# Patient Record
Sex: Female | Born: 1990 | Race: White | Hispanic: No | Marital: Married | Smoking: Never smoker
Health system: Southern US, Community
[De-identification: ages and names within clinical notes are randomized; demographics above are authoritative.]

## PROBLEM LIST (undated history)

## (undated) DIAGNOSIS — F419 Anxiety disorder, unspecified: Secondary | ICD-10-CM

## (undated) DIAGNOSIS — G90A Postural orthostatic tachycardia syndrome (POTS): Secondary | ICD-10-CM

## (undated) DIAGNOSIS — A1801 Tuberculosis of spine: Secondary | ICD-10-CM

## (undated) HISTORY — PX: WISDOM TOOTH EXTRACTION: SHX21

## (undated) HISTORY — PX: WRIST SURGERY: SHX841

## (undated) HISTORY — DX: Tuberculosis of spine: A18.01

## (undated) HISTORY — DX: Anxiety disorder, unspecified: F41.9

---
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
## 2008-05-30 ENCOUNTER — Encounter: Payer: Self-pay | Admitting: Internal Medicine

## 2008-07-29 ENCOUNTER — Encounter: Payer: Self-pay | Admitting: Family Medicine

## 2008-07-31 IMAGING — CT CT HEAD W/O CM
1 of 2 series · 16 of 30 positions shown, 20 images · IV contrast (agent unspecified)
Comparison: 07/13/2005 (normal).

CLINICAL DATA: Syncopal episode, hit back of head. 
 HEAD CT WITHOUT CONTRAST:
TECHNIQUE: Contiguous axial images were obtained from the base of the skull through the vertex according to standard protocol without contrast.

[Series 3: head trauma 2.4 h60s · axial · 0.43mm/px · z∈[-118,+6]mm · 16 of 60 slices shown, 20 images]
[im 4/60  brain]
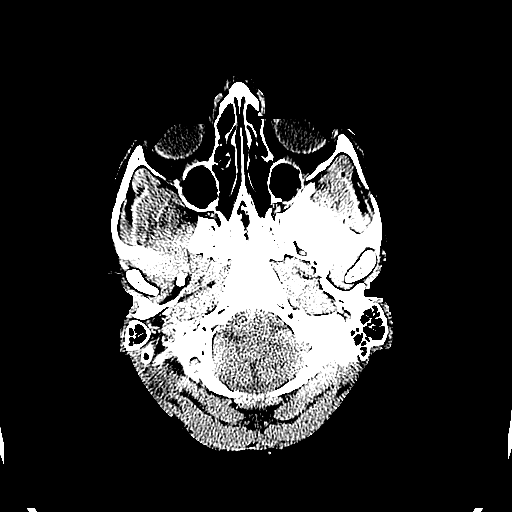
[im 4/60  bone]
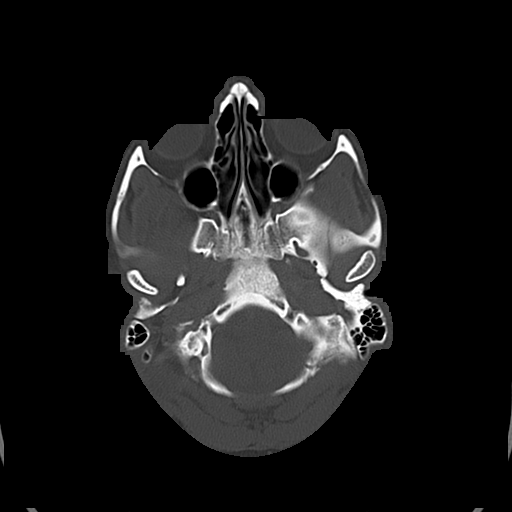
[im 7/60  brain]
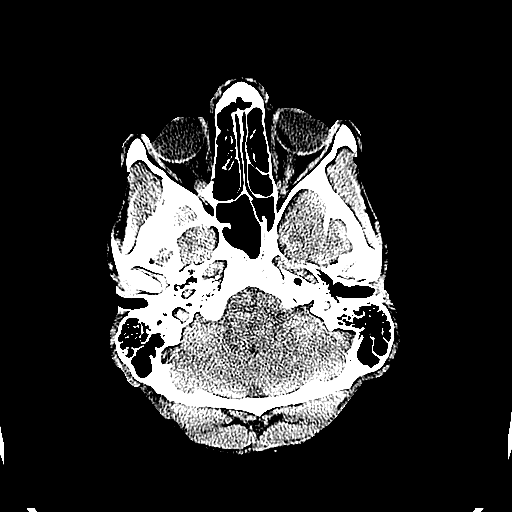
[im 10/60  brain]
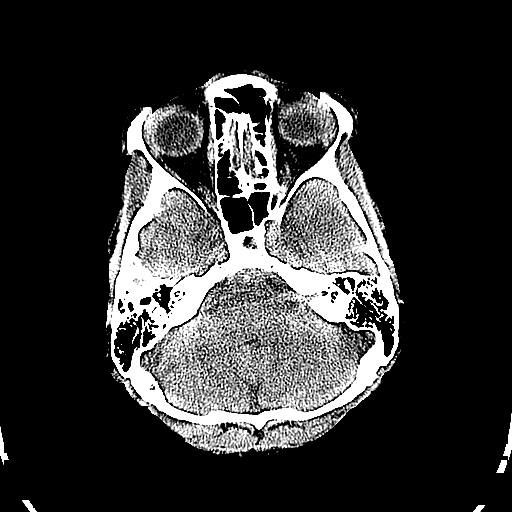
[im 13/60  brain]
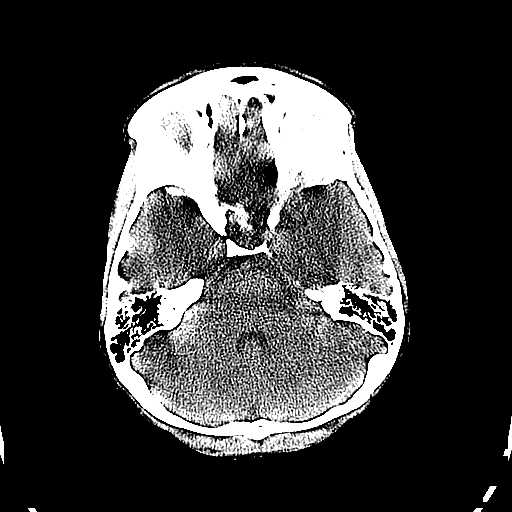
[im 19/60  brain]
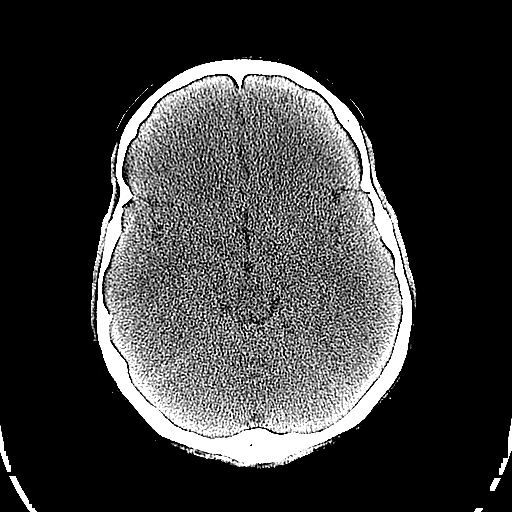
[im 19/60  bone]
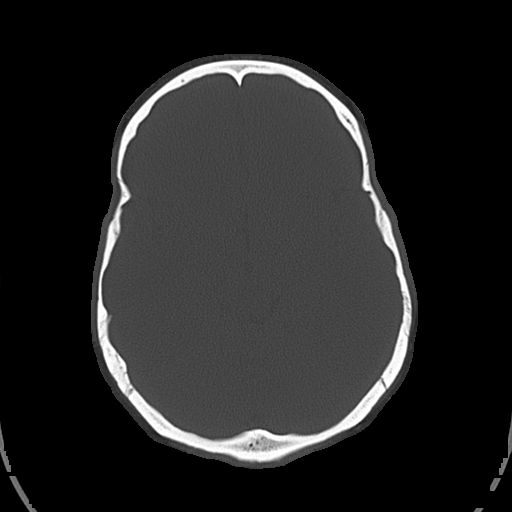
[im 22/60  brain]
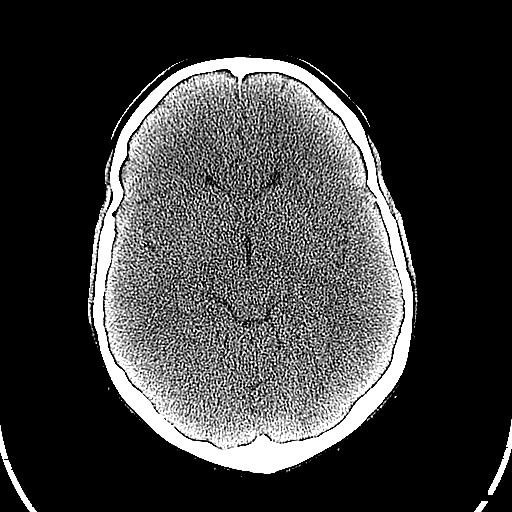
[im 25/60  brain]
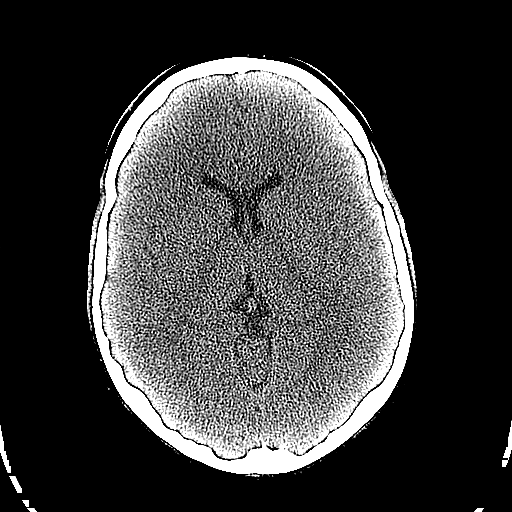
[im 28/60  brain]
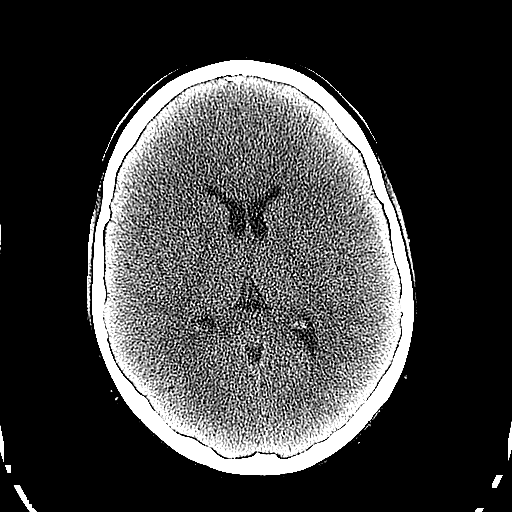
[im 32/60  brain]
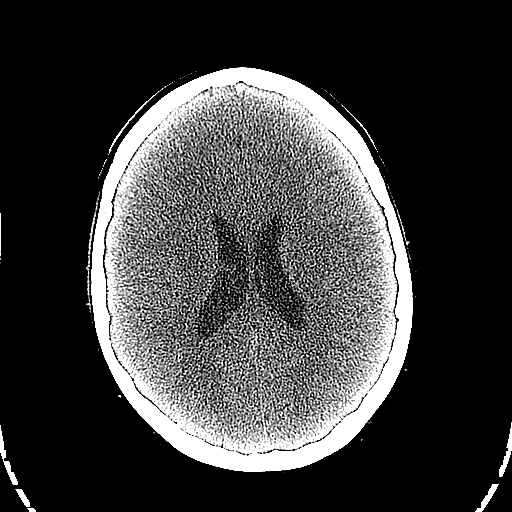
[im 32/60  bone]
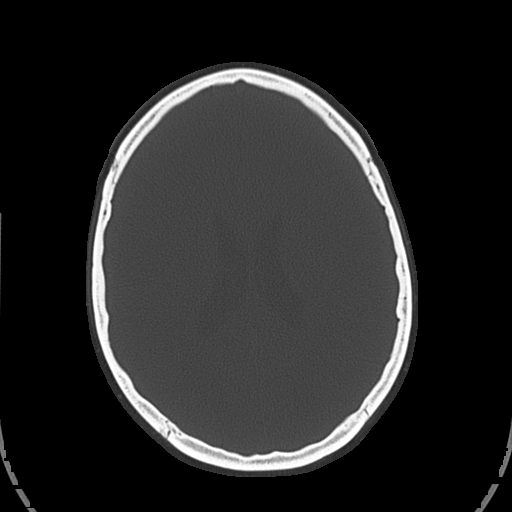
[im 35/60  brain]
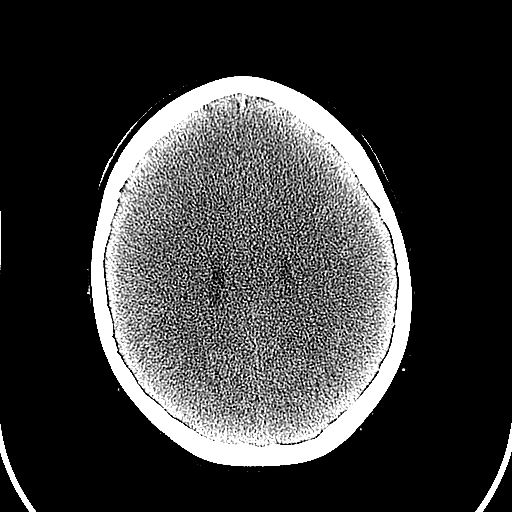
[im 38/60  brain]
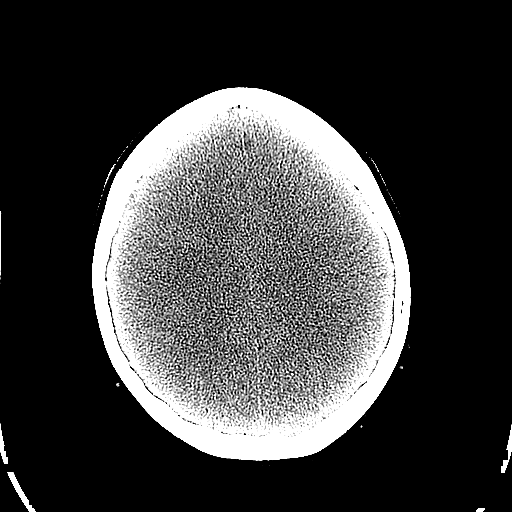
[im 41/60  brain]
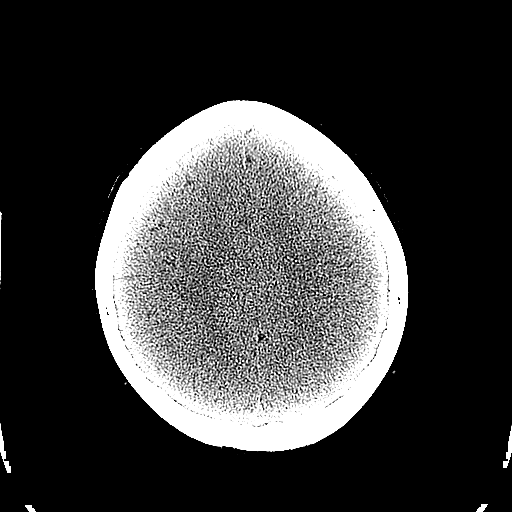
[im 47/60  brain]
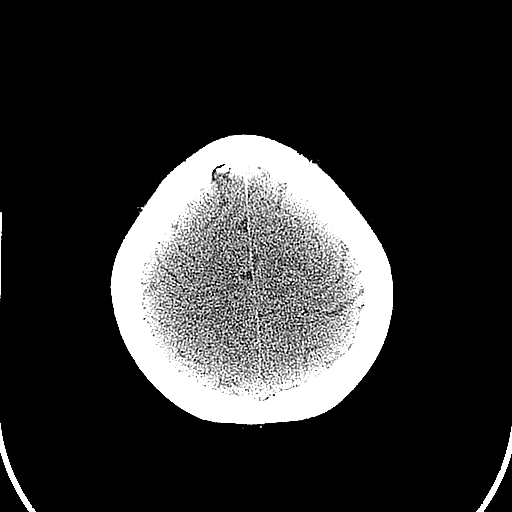
[im 47/60  bone]
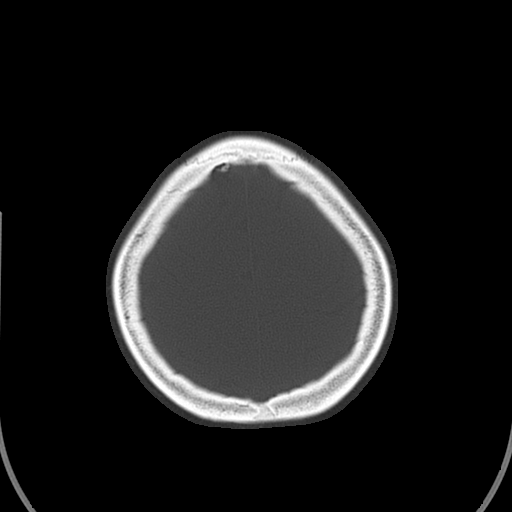
[im 50/60  brain]
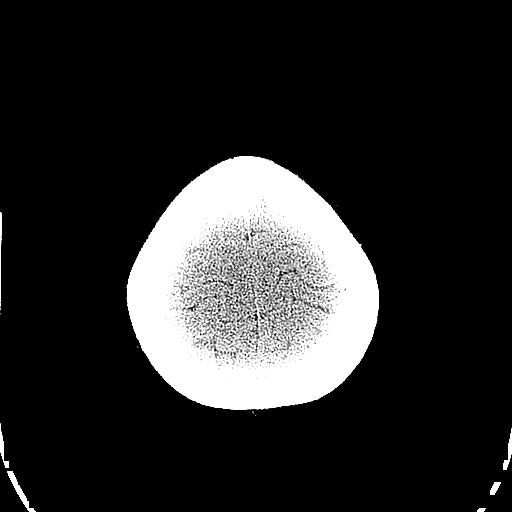
[im 53/60  brain]
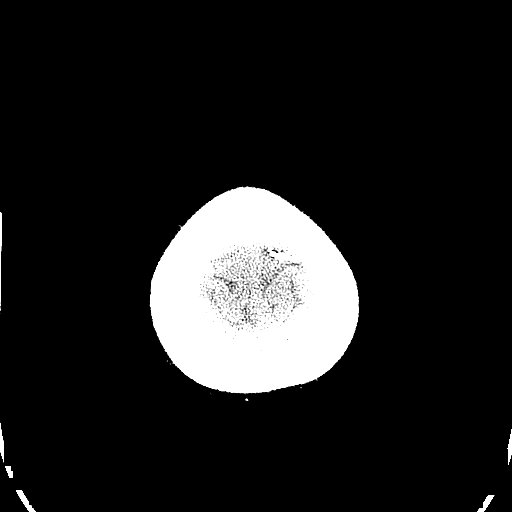
[im 56/60  brain]
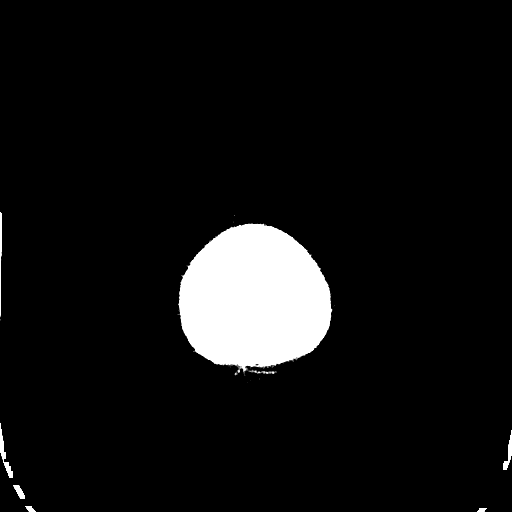

[16 of 30 positions shown; findings below may reference images not displayed]

FINDINGS: There is no evidence of intracranial hemorrhage, brain edema, acute infarct, mass lesion, or mass effect.  No other intra-axial abnormalities are seen, and the ventricles are within normal limits.  No abnormal extra-axial fluid collections or masses are identified.  No skull abnormalities are noted.  No change from 07/13/2005.
IMPRESSION: Negative non-contrast head CT.

## 2009-07-06 ENCOUNTER — Ambulatory Visit: Payer: Self-pay | Admitting: Family Medicine

## 2009-07-06 DIAGNOSIS — M79609 Pain in unspecified limb: Secondary | ICD-10-CM | POA: Insufficient documentation

## 2010-03-30 NOTE — Assessment & Plan Note (Signed)
Summary: swollen hand and thumb//lch   Vital Signs:  Patient profile:   20 year old female Weight:      107 pounds Temp:     98.6 degrees F oral BP sitting:   112 / 70  (left arm)  Vitals Entered By: Doristine Devoid (Jul 06, 2009 9:07 AM) CC: R hand still swollen and painful after fall x1 wk ago    History of Present Illness: 20 yo woman here today for R hand pain and swelling.  pt fell off ladder 1 week ago and fell backward on R wrist and thumb.  immediately started swelling- went to [facility] and was told it wasn't broken after xrays.  was given script for Naproxen- didn't get it filled.  not wearing splint.  pain is most severe at base of thumb.  Current Medications (verified): 1)  Kariva 0.15-0.02/0.01 Mg (21/5)  Tabs (Desogestrel-Ethinyl Estradiol) 2)  Nasacort Aq 55 Mcg/act Aers (Triamcinolone Acetonide(Nasal)) .... 2 Spays Each Nostril Once Daily 3)  Naproxen Dr 500 Mg Tbec (Naproxen) .Marland Kitchen.. 1 Tab By Mouth Two Times A Day X7 Days and Then As Needed.  Take W/ Food.  Allergies (verified): 1)  ! Ceclor  Review of Systems      See HPI  Physical Exam  General:  Well-developed,well-nourished,in no acute distress; alert,appropriate and cooperative throughout examination Msk:  + pain w/ palpation at base of R thumb.  no pain at IP joint.  + TTP over snuffbox.  + edema.  no bruising.  + pain w/ flexion and extension. Pulses:  +2 ulnar/radial pulses   Impression & Recommendations:  Problem # 1:  THUMB PAIN, RIGHT (ICD-729.5) Assessment New pt's pain concerning for carpal/metacarpal fx.  early xrays may not have shown the break.  re-xray and refer to ortho at mom's request.  thumb spica given.  start NSAIDs, ice.  reviewed supportive care and red flags that should prompt return.  Pt expresses understanding and is in agreement w/ this plan. Orders: Ankle / Wrist Splint ([id number]) T-Hand Right 3 views ([id number]) Orthopedic Referral (Ortho)  Complete Medication List: 1)  Kariva  0.15-0.02/0.01 Mg (21/5) Tabs (Desogestrel-ethinyl estradiol) 2)  Nasacort Aq 55 Mcg/act Aers (Triamcinolone acetonide(nasal)) .... 2 spays each nostril once daily 3)  Naproxen Dr 500 Mg Tbec (Naproxen) .Marland Kitchen.. 1 tab by mouth two times a day x7 days and then as needed.  take w/ food.  Patient Instructions: 1)  Take the Naproxen as directed- take w/ food to avoid upset stomach 2)  Go to [address] for your xrays- we'll call you this afternoon w/ the results 3)  We'll let you know about your Ortho appt 4)  Call with any questions or concerns 5)  Hang in there! Prescriptions: NAPROXEN DR 500 MG TBEC (NAPROXEN) 1 tab by mouth two times a day x7 days and then as needed.  take w/ food.  #60 x 0   Entered and Authorized by:   Neena Rhymes MD   Signed by:   Neena Rhymes MD on 07/06/2009   Method used:   Electronically to        [facility] [phone][facility] (retail)       [address]       [place], [place]  [address]       Ph: [phone]       Fax: [phone]   RxID:   [id number]

## 2010-03-30 NOTE — Assessment & Plan Note (Signed)
Summary: sore throat,cbs   Vital Signs:  Patient Profile:   20 Years Old Female Weight:      109.50 pounds Temp:     98.6 degrees F oral Pulse rate:   72 / minute Resp:     14 per minute BP sitting:   118 / 70  (right arm)  Pt. in pain?   no  Vitals Entered By: Ardyth Man (January 22, 2007 11:48 AM)                  Chief Complaint:  cough, sore throat, right ear pain since last night, and URI symptoms.  History of Present Illness:  URI Symptoms      This is an 20 year old woman who presents with URI symptoms.  The symptoms began last night ago.  minimal congestion.  Exposed to Mono.  The patient reports sore throat, earache, and sick contacts.  The patient denies fever, stiff neck, dyspnea, wheezing, rash, vomiting, and diarrhea.    Current Allergies: ! CECLOR      Physical Exam  General:     Well-developed,well-nourished,in no acute distress; alert,appropriate and cooperative throughout examination Head:     Normocephalic and atraumatic without obvious abnormalities. No apparent alopecia or balding. Ears:     External ear exam shows no significant lesions or deformities.  Otoscopic examination reveals clear canals, tympanic membranes are intact bilaterally without bulging, retraction, inflammation or discharge. Hearing is grossly normal bilaterally. Nose:     External nasal examination shows no deformity or inflammation. Nasal mucosa are pink and moist without lesions or exudates. Mouth:     o/p mildly erythematous Neck:     tender anterior cervical lymphadenopathy. No posterior cervical lymph nodes Lungs:     Normal respiratory effort, chest expands symmetrically. Lungs are clear to auscultation, no crackles or wheezes. Heart:     Normal rate and regular rhythm. S1 and S2 normal without gallop, murmur, click, rub or other extra sounds.    Impression & Recommendations:  Problem # 1:  PHARYNGITIS-ACUTE (ICD-462) Will treat empirically for strep.    Patient will f/u if no improvement in 7- 10 days or if it worsens Her updated medication list for this problem includes:    Zithromax Z-pak 250 Mg Tabs (Azithromycin) .Marland Kitchen... As directed Instructed to complete antibiotics.  Complete Medication List: 1)  Zithromax Z-pak 250 Mg Tabs (Azithromycin) .... As directed 2)  Magic Mouthwash  .... 1 tsp swish and spit qid prn  Other Orders: Rapid Strep ([id number])     Prescriptions: MAGIC MOUTHWASH 1 tsp swish and spit QID prn  #12 oz x 0   Entered and Authorized by:   Leanne Chang MD   Signed by:   Leanne Chang MD on 01/22/2007   Method used:   Print then Give to Patient   RxID:   [id number] ZITHROMAX Z-PAK 250 MG  TABS (AZITHROMYCIN) as directed  #1 x 0   Entered and Authorized by:   Leanne Chang MD   Signed by:   Leanne Chang MD on 01/22/2007   Method used:   Print then Give to Patient   RxID:   [id number]  ] Laboratory Results  Date/Time Received: ...................................................................Ardyth Man  January 22, 2007 11:57 AM  Date/Time Reported: ...................................................................Ardyth Man  January 22, 2007 11:57 AM   Other Tests  Rapid Strep: negative

## 2010-03-30 NOTE — Progress Notes (Signed)
Summary: sote throat-dr alejandro  Phone Note Call from Patient Call back at [phone]   Caller: Mom Summary of Call: patient has a sore throat,temp, chills wants appt today   Initial call taken by: Okey Regal Spring,  May 09, 2007 8:50 AM  Follow-up for Phone Call        spoke with pt mom who says pt as sore throat,fever,chills weak ov sched for today with dr Drue Novel...................................................................Marland KitchenKandice Hams  May 09, 2007 9:41 AM  Follow-up by: Kandice Hams,  May 09, 2007 9:41 AM

## 2010-07-13 NOTE — Consult Note (Signed)
NAMEDRENA, HAM NO.:  [id number]   MEDICAL RECORD NO.:  [id number]          PATIENT TYPE:  INP   LOCATION:  3702                         FACILITY:  [facility]   PHYSICIAN:  Doylene Canning. Ladona Ridgel, MD    DATE OF BIRTH:  1990-12-21   DATE OF CONSULTATION:  12/05/2006  DATE OF DISCHARGE:                                 CONSULTATION   REFERRING PHYSICIAN:  Deanna Artis. Hickling, M.D./Michael E. Norins, MD   INDICATION FOR CONSULTATION:  Evaluation of recurrent episodes of  syncope.   HISTORY OF PRESENT ILLNESS:  The patient is an 20 year old, senior in  high school whose initial episode of syncope began in June 2007.  At  that time, she had been playing volleyball and not eating breakfast and  without warning, she had a syncopal episode.  She was reportedly then to  be unconscious for approximately 40 seconds and then was not  particularly awake or aware of things for another 15 minutes.  She did  go to the emergency room where initial EEG, Holter monitor and EKG were  all within normal limits.  She had no recurrent syncopal episodes until  earlier this year.  She had noted in the interim, according to her  mother, that there had been some anxiety problems and she actually  developed carpal pedal spasm associated with these.  These anxiety  attacks are only associated with her being in school and there have not  been others.  The patient's mother rather noted an episode of anxiety  and altered consciousness when she was out shopping with her boyfriend.  There was no loss of bowel or bladder continence and she did not  apparently bite her tongue.  Following these episodes, she feels  fatigued and weak and is fairly poorly responsive for 10-15 minutes.  Her mother states that she has seen her pass out and be unconscious for  over 10 minutes.  She saw Dr. Sharene Skeans last month and at that time, she  wore a CardioNet monitor and, according to the mother, had several  syncopal  episodes with the cardiac monitor demonstrating heart rates in  the 151-70 range.  I do not have these to review, but apparently this  demonstrated sinus tachycardia.  The patient was, at that time, placed  initially on fluid increases and was instructed to drink approximately  64 ounces of Gatorade a day.  She continued to have episodes and was  begun on Florinef, although she initially was nauseated with this.  Her  dose was decreased to 0.1 mg half tablet daily.  The patient was, on the  day of admission, getting out of a car and when she pulled the door  closed, she passed out and hit her occipital area of her head with  ecchymotic area there.  She had retrograde and antegrade amnesia, but a  CT scan was reportedly negative.  She is admitted for additional  evaluation.  The patient's past medical history is really unremarkable  except for having tubes placed in her ears in the past and a ganglion  cyst removed.  She had been on Florinef, but on no other medications.   ALLERGIES:  No known drug allergies.   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  She dances competitively.  She denies tobacco or  alcohol or drug use.   FAMILY HISTORY:  Negative for syncope.  There was an uncle with mental  retardation and hydrocephalus who died at a young age.   REVIEW OF SYSTEMS:  As noted in HPI.  No additional review of systems  could be obtained when I examined the patient, as she was not  particularly conversive at the time I spoke to her.  She would answer  questions as yes or no and not give it much additional information.   PHYSICAL EXAMINATION:  GENERAL:  She is a pleasant, well-appearing,  young woman in no distress.  VITAL SIGNS:  Blood pressure was 105/60, pulse 60 and regular,  respirations were 18.  There was no orthostatic change noted.  HEENT:  Normal.  The pupils are equal and round.  Oropharynx is moist.  Sclerae anicteric.   NECK:  No jugular venous distention.  There is no thyromegaly.  Trachea  is midline.  Carotid 2+ symmetric.  LUNGS:  Clear bilaterally to auscultation.  No wheezes, rales or rhonchi  were present.  There is no increased work of breathing.  CARDIAC:  Regular rate and rhythm with normal S1-S2.  There is a rare premature  beat noted.  There are no murmurs, rubs or gallops otherwise.  The PMI  was not enlarged nor laterally displaced.  ABDOMEN:  Soft, nontender, nondistended.  There is no organomegaly.  Bowel sounds are present.  No rebound or guarding.  EXTREMITIES:  No cyanosis, clubbing or edema.  Pulses were 2+ and  symmetric.  NEUROLOGIC:  Alert and oriented x3 with cranial nerves intact.  Strength  was 5/5 and symmetric.   LABORATORY DATA AND X-RAY FINDINGS:  EKG demonstrates sinus rhythm with  normal axis and intervals.   IMPRESSION:  1. Unexplained syncope.  2. Amnesia.  3. Small area of ecchymosis on the posterior portion her scalp.   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  It is interesting that these episodes have  all occurred while she was in school and her mother, who I spoke with at  length. suggests that her school is quite stressful.  It is not clear to  me whether this is due to social stress or educational stressors.  As it  stands, I have recommended that we undergo tilt table testing and  proceed with 2-D echocardiogram to rule out any structural problems with  the heart block, although I think this is highly unlikely.  As the  patient also has history of panic attacks, I would, depending on the  results of the tilt table test, consider a trial of increased fluid  volume with extra fluids along with Florinef as initial low-dose with  gradual up titration and consider  a centrally acting drug as well like  Lexapro or Zoloft.  With regard to driving, she will be instructed not  to drive at least x6 months.  With regard to performance in dance, I  think in the short-term at least, we would recommend that she not be  allowed to go  back to competitive dancing or performing stressful  situations as the likelihood of syncope would be quite high.  Will plan  to follow the patient with you.      Doylene Canning. Ladona Ridgel, MD  Electronically Signed     GWT/MEDQ  D:  12/06/2006  T:  12/07/2006  Job:  [id number]   cc:   Rosalyn Gess. Norins, MD

## 2010-07-13 NOTE — H&P (Signed)
NAMEVELLA, COLQUITT NO.:  [id number]   MEDICAL RECORD NO.:  [id number]          PATIENT TYPE:  INP   LOCATION:  3702                         FACILITY:  [facility]   PHYSICIAN:  Rosalyn Gess. Norins, MD  DATE OF BIRTH:  [birth year]   DATE OF ADMISSION:  12/04/2006  DATE OF DISCHARGE:                              HISTORY & PHYSICAL   CHIEF COMPLAINT:  Syncope.   HISTORY OF PRESENT ILLNESS:  Miss Morgan [name] is an 20 year old who has had a  history of syncope going back to 2007.  She would have sudden drop  attacks.  She was evaluated in 2007, by hospital base evaluation.  She  had an EEG at that time that was negative and her evaluation in general  was negative.  Recently, in September, the patient had recurrent  syncope.  She was seen in the emergency department for this.  She was  stable at that time.  Labs were normal.  She was stable for discharge  and was followed up by Dr. Sharene Skeans for outpatient neurology evaluation.  According to parent's report, her evaluation which included EEG was  unremarkable and Dr. Sharene Skeans found no evidence of a CNS cause of her  syncope.  Dr. Sharene Skeans did arrange for an event recorder and scheduled  the patient to see Dr. Lewayne Bunting for evaluation with that appointment  scheduled for October 8.   In the last week, the patient has had at least three events of sudden  syncope.  She had one while lying down, one where she fell out of her  chair.  Today, she was riding in the back-seat of a vehicle with friends  and started feeling lightheaded and hot.  She got out of the vehicle and  by friend's report, she looked unsteady on her feet.  They tried to help  her back into the car, but during this maneuver, she suddenly loss  consciousness, fell out of the car striking her occipital area of her  skull.  She was unconscious for a short period of time, but by the time  EMS arrived, she was awake.  She was stable at that time.  She was  brought to  emergency department where she continued to remain stable  except for amnesia for the days events and also claims she was unable to  recognize family members at the bedside.  She was in no distress except  to be emotionally disturbed by her amnesia.   PAST SURGICAL HISTORY:  None.   PAST MEDICAL HISTORY:  1. Measles as a child.  2. Syncope as noted, otherwise healthy.   FAMILY HISTORY:  Noncontributory   SOCIAL HISTORY:  The patient is an Chief Executive Officer.  She has been in  advanced dance at [employer].  She evidently had been under a  significant amount of stress last spring.  She has been seeing a  Veterinary surgeon during the summer, but does not feel she made much progress.  Her parents report she has been well-adjusted and doing well.  She has  no history of drug use or alcohol use.  PHYSICAL EXAMINATION:  VITAL SIGNS:  Temperature 97.2, blood pressure  123/81, pulse 78, respirations 20.  GENERAL:  This is a well-nourished, well-developed, well-groomed, white  female in no acute distress.  HEENT:  Normocephalic, atraumatic with no Battle sign, no racoon eyes.  NECK:  Supple without thyromegaly.  No adenopathy was noted in the  cervical or supraclavicular regions.  CHEST:  No CVA tenderness.  LUNGS:  Clear with no rales, wheezes or rhonchi.  CARDIOVASCULAR:  The patient had 2+ radial pulse.  She had no JVD or  carotid bruits.  Her precordium was quiet.  She had a regular rate and  rhythm.  I appreciated no murmurs, no rubs or gallops.  ABDOMEN:  Nontender.  PELVIC/RECTAL:  Deferred.  EXTREMITIES:  Without clubbing, cyanosis, edema or deformity.  NEUROLOGIC:  The patient is awake.  She is cooperative.  She is oriented  to place, not to date or day and did not recognize her family members.  Cranial nerves 2-12 reveal the patient to have pupils that were equal,  round, react to light accommodation.  Extraocular muscles were intact.  Funduscopic exam was unremarkable with normal disk  margins and normal  vasculature.  She had normal facial muscle movement.  She had no  deviation of her tongue.  She had a normal shoulder shrug.  Motor  strength was 5/5 throughout.  DTRs were 2+ and symmetrical throughout.  Cerebellar function was unremarkable with normal gait and good balance.  Mini-mental status exam:  The patient is not oriented to date, day or  current events.  She made one error with 5 digits forward.  She made one  error with five digits in reverse order.  She was able to spell the word  world in reverse order.  She did well with serial 7's, but was slow.  Abstract thinking on parables was concrete.  Judgment was normal.  She  remembered 3/3 objects at 5 minutes.   LABORATORY DATA AND X-RAY FINDINGS:  A 12-lead electrocardiogram  revealed sinus rhythm and was a normal EKG.  CT scan of the brain was  unremarkable for acute injury or bleed.   Comprehensive metabolic panel was unremarkable with a glucose of 85,  normal renal function, normal electrolytes, normal liver functions.  Hemoglobin was normal at 11.6 g.  Alcohol was undetectable.   ASSESSMENT/PLAN:  The patient with recurrent syncope of a drop type.  She has had a negative neurologic evaluation as noted including a normal  EEG.  Dr. Sharene Skeans felt the patient needed to be evaluated for possible  cardiac related syncope.  She has been wearing a CardioNet monitor and  one event was captured this morning at 1044 hours which revealed what  appears be a sinus tachycardia with no other abnormality.  Report on the  chart.  The patient was to see Dr. Lewayne Bunting for electrophysiology  evaluation on Wednesday, October 8.   PLAN:  1. Telemetry admission for observation.  Cardiology has been notified      for an EP consult in hospital.  2. Neurologic.  The patient with mild amnesia.  Question of a possible      concussion.  Exam was nonfocal.  Neurologic checks each  shift.      Rosalyn Gess Norins, MD   Electronically Signed     MEN/MEDQ  D:  12/04/2006  T:  12/05/2006  Job:  [id number]   cc:   Deanna Artis. Sharene Skeans, M.D.  Leanne Chang, M.D.

## 2010-07-13 NOTE — Consult Note (Signed)
Morgan [name], Morgan [name] NO.:  [id number]   MEDICAL RECORD NO.:  [id number]          PATIENT TYPE:  INP   LOCATION:  3702                         FACILITY:  [facility]   PHYSICIAN:  Deanna Artis. Hickling, M.D.DATE OF BIRTH:  01-Feb-1991   DATE OF CONSULTATION:  12/05/2006  DATE OF DISCHARGE:                                 CONSULTATION   CHIEF COMPLAINT:  Syncope.   HISTORY OF PRESENT ILLNESS:  Morgan [name] is an 20 year old woman who I have  seen on two occasions, first in June 2007 and the second November 15, 2006.  I was asked to see her after she had suffered a syncopal episode  at school, fell out of the car, struck her head and developed both  retrograde and anterograde amnesia.  She was admitted to the hospital  for observation and plans were made to have her seen by Dr. Lewayne Bunting.   The patient has had episodes of syncope that have been present since  June 2007 when I first saw her.  The first episode happened while she  was at school.  She had not eaten breakfast and had been playing  volleyball.  While walking off the court, she collapsed without warning.  She did not hit the floor with her head.  She lost consciousness for 20-  40 seconds but was poorly responsive for about 15-20 minutes.  She was  dazed and not focused.  She was brought by ambulance to [facility]. [facility].  She awakened by the time she was at [facility]. [facility] but was washed out, however, responsive.   My assessment was that she had a normal examination.  I reviewed her ER  record which showed a normal CT scan of the brain and sinuses.  We  performed an EEG, Holter monitor and EKG.  The EKG was normal.  Laboratory studies were normal. EEG and Holter monitor were also normal.   The next time I saw her was November 15, 2006.  She had had a series of  anxiety attacks where she had tachycardia, felt that she could not  breathe and developed carpal pedal spasm.  At  school, there were nurses  who were able to evaluate her and EMS had to be called.  Interestingly,  the patient had no anxiety attacks over the summer.  These began after  school started.   The patient had an episode of an anxiety attack where she was with her  boyfriend at [facility].  The only thing that she members was a lady screaming  and then she lost consciousness.  She does not have cyanosis.  Her vital  signs are stable.  Her pulse was  high.  She was able to be calmed.  She  was not taken to the hospital.  She drove herself home and after having  had the syncopal episode.  The second episode occurred in school.  She  had eaten a bagel and small juice and had finished dance for about an  hour and was watching a video, suddenly her heart began  to raise.  Her  stomach seemed empty.  She was hazy, felt her whole body getting heavy.  She became numb and her eyes shut.  She did not arouse and EMS was  called.   This has happened several more times.  We decided to place her on a  CardioNet monitor that have shown episodes of tachycardia in association  with some of her syncopal spells.  She has also had some tachycardia  without apparent symptoms.   I initially recommended that she increase her intake of electrolyte  fluid such as Powerade.  We then shifted her to 0.1 mg of Florinef.  I  recommended against the use of Xanax but suggested that she might need  to have antidepressants for general anxiety disorder and that perhaps  Zoloft could take care of what appeared to be neurally mediated syncope  as well as her anxiety attacks.   The patient had one other syncopal episode that I am aware of while she  was on the CardioNet that showed an episode of tachycardia with heart  rates in the 150s-180s.  Interestingly, during the day her heart rate is  high, but  when she is asleep her heart rate sits around 50.   The patient has had episodes lying down, sitting up or standing.  On the  day  of her admission, she felt lightheaded somewhere a little after noon-  time.  She tried to get out of the car.  Her friends reported that she  was unsteady.  They tried to get her back into the car.  While she was  trying to pull the door closed, she fell out, twisted and smacked the  back of her head.  She lost consciousness for a short period of time.  The patient had amnesia at that time, did not recognize her parents, was  frightened about everything and gradually began to remember things.  She  still has problems with memory that are spotty at this time.   I was asked to see her because of her syncopal episodes and her amnesia.  I have placed on the chart the two evaluations of her.   PAST MEDICAL HISTORY:  Negative except as noted above.   PAST SURGICAL HISTORY:  1. Myringotomy tubes x2.  2. Ganglion cyst removed from her left wrist x2.   MEDICATIONS:  1. Florinef 0.1 mg one half tablet daily.  2. She has p.r.n. medicines here for pain and nausea.   ALLERGIES:  NONE KNOWN.   FAMILY HISTORY:  Negative for syncope.  Positive for seizures in a  paternal uncle who also had congenital hydrocephalus and mental  retardation and died in his teens.   SOCIAL HISTORY:  Holiday representative at [employer].  She is an  accomplished Horticulturist, commercial.  She has been a driver.  She does not use tobacco,  alcohol or drugs.   PHYSICAL EXAMINATION:  VITAL SIGNS:  Today, temperature 98.2, blood  pressure 106/60 resting pulse 60, respirations 20.  Orthostatic blood  pressures recumbent 106/60, resting pulse 88; sitting 109/61, resting  pulse 73; standing 107/60, resting pulse 75.  GENERAL APPEARANCE:  This is a blond hair, brown eyed, right-handed  young woman in no acute distress.  She is petite. HEENT:  Supple neck.  No bruits.  No infection.  LUNGS:  Clear.  HEART:  No murmurs.  Pulses normal.  ABDOMEN:  Soft.  Bowel sounds normal.  No hepatosplenomegaly.  EXTREMITIES:  Normal.  NEUROLOGIC:  The  patient was awake and alert.  She can recall 3/3  objects at 30 seconds but only 1/3 at 3 minutes.  She spells the word  world backwards correctly.  She can subtract sevens serially from 100  correctly.  She had one error that she self corrected.  She names  objects, follows commands, repeat phrases.  Cranial nerves:  Round  reactive pupils.  Visual fields full.  Extraocular movements full.  Symmetric facial strength.  Midline tongue.  Air conduction greater than  bone conduction bilaterally.   Motor examination:  Normal strength, tone and mass.  Good fine motor  movements.  No drift.  Sensation intact to primary and cortical  modalities.  Cerebellar examination:  Good finger-to-nose, heel-knee-  shin was normal.  Gait normal.  Deep tendon reflexes normal proximally  and diminished distally.  The patient had bilateral flexor plantar  response.   IMPRESSION:  1. 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  2. Closed head injury with brief loss of consciousness 850.01.  3. Post-traumatic retrograde and anterograde amnesia.   PLAN:  1. Hold Florinef.  2. Tilt table test after cardiology has been able to evaluate her.  3. Cardiology needs to review the CardioNet findings.  4. The patient may need Zoloft if she cannot tolerate Florinef.  5. I see no reason to carry out further workup for her amnesia.  I      have reviewed the CT scan and it is normal.  Full electrolytes were      normal.  I discussed the course of workup with the parents.  The      reason I do not believe the patient is having postural orthostatic      tachycardia syndrome is that she has had episodes, both sitting and      lying which is unusual for that condition.   I appreciate the opportunity to participate in her care.  I will follow  along with you.      Deanna Artis. Sharene Skeans, M.D.  Electronically Signed     WHH/MEDQ  D:  12/05/2006  T:  12/05/2006  Job:  [id number]   cc:   Doylene Canning. Ladona Ridgel, MD   Sharlet Salina, M.D.

## 2010-07-13 NOTE — Op Note (Signed)
NAMEANNABEL, GIBEAU NO.:  [id number]   MEDICAL RECORD NO.:  [id number]          PATIENT TYPE:  INP   LOCATION:  3702                         FACILITY:  [facility]   PHYSICIAN:  Doylene Canning. Ladona Ridgel, MD    DATE OF BIRTH:  [birth year]   DATE OF PROCEDURE:  12/06/2006  DATE OF DISCHARGE:                               OPERATIVE REPORT   PROCEDURE PERFORMED:  Head-up tilt table testing.   INDICATIONS:  Unexplained syncope.   INTRODUCTION:  The patient is an 20 year old young lady with a history  of recurrent episodes of syncope.  She is been tried on extra fluid  replacement and Florinef but has had recurrent episodes of syncope.  She  was admitted to hospital and is referred for head-up tilt table testing.   PROCEDURE:  After informed consent was obtained the patient is taken to  diagnostic EP lab in fasting state.  She is placed in the supine  position.  Her initial heart rate was in the 50s, her blood pressure was  110.  She was placed in the 70 degree head-up tilt table position and  her heart rate increased into the 95-100 range.  Blood pressure remained  relatively stable initially.  At approximately 11 minutes into tilting  her blood pressure dropped from the 120s into the 90s and she noted  feeling fatigued.  This was associated with increasing heart rate into  the 105 range.  The patient's blood pressure, however stabilized and for  the next 8 or 10 minutes she remained in the 110 range.  Her heart rate  varied, but was between 105 and 115.  She had one spontaneous drop in  blood pressure into the 60s with concomitant increase in the heart rate  up to 113 but again this stabilized.  She remained in this position for  a total of 30 minutes without syncope.  She was placed back in the  supine position and Isuprel was initiated initially at 0.5 mcg per  minute, then 0.25 mcg per minute.  The heart rate increased from the 50s  up to 130.  She was placed in the 70  degrees head-up tilt table  position.  Blood pressure actually remained in this range, initially and  her heart rate actually decreased going down initially into the 80s and  as low as the 50s.  She became severely nauseated.  Her heart rate  increased in the 160 range.  She started to vomit.  There is no pulse  appreciated.  She was returned back to the supine position and Isuprel  was discontinued.  She eventually recovered to normal heart rate and  blood pressure and her nausea and vomiting resolved.  She did not have  any frank syncope.   COMPLICATIONS:  There were no immediate procedure complications.   RESULTS:  This demonstrates a positive head-up tilt table test for  evidence of postural orthostatic tachycardia syndrome (POTS) as based on  the over 30 point increase in heart rate going from the supine to the  upright position.  It also demonstrates symptomatic response to head-up  tilt table testing on Isuprel with very profound nausea and  vomiting and hypotension as manifested by a lack of pulse obtained as  she was in the process of vomiting.  The patient did not lose  consciousness completely.  It also demonstrates she some spontaneous  sudden drops in blood pressure which were not sustained.      Doylene Canning. Ladona Ridgel, MD  Electronically Signed     GWT/MEDQ  D:  12/06/2006  T:  12/07/2006  Job:  [id number]   cc:   Deanna Artis. Sharene Skeans, M.D.  Leanne Chang, M.D.  Rosalyn Gess Norins, MD

## 2010-07-13 NOTE — Discharge Summary (Signed)
[name], [name] NO.:  [id number]   MEDICAL RECORD NO.:  [id number]          PATIENT TYPE:  INP   LOCATION:  3702                         FACILITY:  [facility]   PHYSICIAN:  Valerie A. Felicity Coyer, MDDATE OF BIRTH:  02/26/1991   DATE OF ADMISSION:  12/04/2006  DATE OF DISCHARGE:  12/07/2006                               DISCHARGE SUMMARY   DISCHARGE DIAGNOSES:  1. 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  2. Amnesia status post concussion.  3. Anxiety.   HISTORY OF PRESENT ILLNESS:  Ms. Morgan [name] is an 20 year old white female  admitted on December 04, 2006 after a syncopal event.  Prior to this  admission, she had been seen in consultation by Dr. Sharene Skeans of  neurology for these sudden drop attacks.  According to mother, she has  had approximately 13 episodes in the last 1 month.  She apparently  underwent an EEG which was unremarkable, and Dr. Sharene Skeans found no  evidence of any CNS cause for her syncope and recommended an EP study.  On the day of admission, the patient was riding in the back seat of a  vehicle with friends and started to feel lightheaded and hot.  She got  out of the car and apparently looked unsteady on her feet.  The patient  then suddenly lost consciousness and fell out of the car, striking her  occipital area of her skull and was unconscious for a short period of  time.  She was admitted for further evaluation and treatment.   HOSPITAL COURSE:  Problem 1.  Recurrent syncopal events secondary to  postural orthostatic tachycardia syndrome.  The patient was admitted and  was seen in consultation by Dr. Gilman Schmidt of [facility].  The  patient underwent a tilt table test which was positive for postural  orthostatic tachycardia.  She had heart rates as high as 160.  She also  developed nausea and vomiting with no obtainable blood pressure or pulse  at that moment.  The patient was returned to a supine  position, and all  of her symptoms resolved.  It was recommended by Dr. Ladona Ridgel that the  patient increase her sodium intake as well as her fluid intake and that  she be continued on a low dose of Florinef.  It was also recommended  that the patient be started on Zoloft or Lexapro, and they strongly  consider removing stressors such as dance which apparently has been  causing the patient a lot of anxiety.  Also of note, the patient  underwent echo bubble study which showed a left ventricular ejection  fraction of 60-65% with no mitral regurgitation.  There was noted to be  trivial tricuspid regurgitation.  There was no right to left shunt noted  on bubble study.   Problem 2.  Amnesia.  The patient does have amnesia with little or no  recall of anything prior to this syncopal event prompting her admission.  Per Dr. Sharene Skeans, this should continue to slowly improve.  She did have  a CT of the head which  showed no acute changes on admission.   DISCHARGE MEDICATIONS:  1. Florinef 0.5 mg p.o. b.i.d.  2. Zoloft 25 mg p.o. daily in the evening.   DISPOSITION:  The patient will be discharged to home.   FOLLOWUP:  The patient will be scheduled to follow up with Dr.  Blossom Hoops.  We will also give the patient a pair of TED hose to try to  see if this improves her symptoms and will defer to the patient's  primary care whether or not to continue TED hose long-term.  They are to  followup as needed with Dr. Lewayne Bunting and with Dr. Sharene Skeans.      Sandford Craze, NP      Raenette Rover. Felicity Coyer, MD  Electronically Signed    MO/MEDQ  D:  12/07/2006  T:  12/07/2006  Job:  [id number]

## 2010-07-16 NOTE — Procedures (Signed)
EEG:  [id number]   HISTORY:  A 20 year old female with a history of fainting that occurred 2-3  weeks ago.  Study is being done to look for the presence of seizures.  Diagnostic Code  780.2.   PROCEDURE:  The study was carried out of 32-channel digital Cadwell recorder  reformatted into 16-channel montages with 1 devoted to EKG.  The patient was  awake during the recording.  The International 10-20 system of lead  placement was used.   DESCRIPTION OF FINDINGS:  Dominant frequency is a 9 Hz rhythmically  contoured 50-60 microvolt activity that is well regulated AND attenuates  partially with eye opening.   Background activity is a mixture of predominately alpha and low voltage of  beta range activity.  Occasional theta range components are superimposed  centrally and posteriorly among the widespread alpha range activity.   There was no focal slowing in the background.  There was no interictal  epileptiform activity in the form of spikes or sharp waves.  Intermittent  photic stimulation failed to induce a definite driving response.  Hyperventilation caused little change in background activity.  EKG showed a  regular sinus rhythm with ventricular response of 66 beats/min.   IMPRESSION:  Normal waking record.      Deanna Artis. Sharene Skeans, M.D.  Electronically Signed     WGN:FAOZ  D:  08/19/2005 12:16:28  T:  08/19/2005 14:05:07  Job #:  [id number]

## 2010-07-16 NOTE — Op Note (Signed)
   NAMENOVALYNN, BRANAMAN NO.:  [id number]   MEDICAL RECORD NO.:  [id number]                   PATIENT TYPE:   LOCATION:                                       FACILITY:   PHYSICIAN:  Nadara Mustard, M.D.                DATE OF BIRTH:   DATE OF PROCEDURE:  04/26/2001  DATE OF DISCHARGE:                                 OPERATIVE REPORT   PREOPERATIVE DIAGNOSIS:  Ganglion, scapholunate, dorsal left wrist.   POSTOPERATIVE DIAGNOSIS:  Ganglion, scapholunate, dorsal left wrist.   PROCEDURE:  Excision of dorsal left wrist scapholunate ganglion.   SURGEON:  Nadara Mustard, M.D.   ANESTHESIA:  General.   ESTIMATED BLOOD LOSS:  Minimal.   TOURNIQUET TIME:  None.   DISPOSITION:  To PACU in stable condition.   INDICATION FOR PROCEDURE::  The patient is a 20 year old girl with a painful  dorsal left wrist ganglion, who has failed conservative care and presents at  this time for excision.  The risks and benefits were discussed with her  mother, including infection, neurovascular injury, recurrence of the  ganglion.  The patient's mother states she understands and wishes to proceed  at this time.   DESCRIPTION OF PROCEDURE:  The patient was brought to the OR and underwent a  general anesthetic.  After an adequate level of anesthesia obtained, the  patient's left upper extremity was prepped using Duraprep, draped into a  sterile field.  The dorsal ganglion was excised.  The skin was closed.  The  wound was covered with a sterile dressing.  The patient was discharged to  home in stable condition, to follow up in the office in one week.                                               Nadara Mustard, M.D.    MVD/MEDQ  D:  11/06/2001  T:  11/06/2001  Job:  [id number]

## 2010-07-16 NOTE — Op Note (Signed)
Morgan [name], Morgan [name]                         ACCOUNT NO.:  [id number]   MEDICAL RECORD NO.:  [id number]                   PATIENT TYPE:  AMB   LOCATION:  DSC                                  FACILITY:  [facility]   PHYSICIAN:  Nadara Mustard, M.D.                DATE OF BIRTH:  08-12-1990   DATE OF PROCEDURE:  05/15/2003  DATE OF DISCHARGE:  05/15/2003                                 OPERATIVE REPORT   PREOPERATIVE DIAGNOSIS:  Recurrent ganglion cyst dorsal left wrist,  scapholunate ligament interval.   POSTOPERATIVE DIAGNOSIS:  Recurrent ganglion cyst dorsal left wrist,  scapholunate ligament interval.   PROCEDURE:  Excision ganglion cyst.   SURGEON:  Nadara Mustard, M.D.   ANESTHESIA:  LMA.   ESTIMATED BLOOD LOSS:  Minimal.   TOURNIQUET TIME:  20 minutes at 200 mmHg.   DISPOSITION:  To PACU in stable condition.   INDICATIONS:  The patient is a 20 year old girl who was status post excision  of a ganglion cyst on 04/26/2001, approximately 2 years ago.  The patient  states that he has had recurrent episodes of swelling recently and that this  has been painful with activities, and she and her family wish to proceed  with excision of the recurrent ganglion cyst.  Risks and benefits were  discussed including infection, neurovascular injury, persistent pain,  recurrent swelling.  The patient and her family state that they understand  and wish to proceed at this time.   DESCRIPTION OF PROCEDURE:  The patient was brought to operating room #4, and  underwent a general anesthetic.  After an adequate level of anesthesia was  obtained, the patient's left upper extremity was prepped using DuraPrep and  draped in a sterile field.  Her transverse dorsal incision was used again  and blunt dissection was carried down to a small ganglion cyst coming from  the scapholunate ligament area.  This cyst was excised in one block of  tissue including approximately a 3 x 3 mm area from the  scapholunate  interval to prevent recurrence of the cyst.  The wound was irrigated with  normal saline.  The incision was closed using a running intracuticular 4-0  Monocryl suture.  The incision was covered with Adaptic, orthopedic sponges,  sterile Webril and a Coban dressing.  The patient was extubated and taken to  the PACU in stable condition.  Plan for followup in one week.                                               Nadara Mustard, M.D.    MVD/MEDQ  D:  05/15/2003  T:  05/19/2003  Job:  [id number]

## 2010-07-16 NOTE — Assessment & Plan Note (Signed)
[facility]                        [place] OFFICE NOTE   KARENE, BRACKEN                      MRN:          [id number]  DATE:07/03/2006                            DOB:          [birth year]    REASON FOR VISIT:  Anxiety attack.   Morgan [name] is a 20 year old female who reports that she has been under a lot  of stress due to school dancing.  She reports that she was doing okay  until this past Monday when she suddenly got a panic attack.  She  described it as a sudden sensation of tingling in her fingers and feet,  leading to hyperventilation and palpitations.  She was seen by EMS and  was also told that she was having a panic attack.  The patient had  another episode several days later.  This all seems to be occurring at  school and associated with the dance class later on after school.  Morgan [name] does reiterate that she is very overwhelmed with the amount of  school work she has to do, in addition to studying for the SATs.  Additionally, she was recently told that she needed to make up an  assignment that just added more stressors to her baseline.  She denies  any homicidal or suicidal ideation.   Of note, Morgan [name] had a dance recital on Friday, and due to the amount of  stressors and anxiety, her mother provided her with a quarter tablet of  Xanax 0.5 mg.  This led to Morgan [name] to become extremely drowsy.  It was to  the point that she had difficulties with her dancing.  Later than day,  she sustained a fracture to her right hand which may or may not have  been related to the medication.  The mother is extremely concerned about  her anxiety and her stressors.  Mom feels that the school can help  alleviate much of her stressors by perhaps adjusting or making allowance  regarding the make-up assignment that was given to her.   PAST MEDICAL HISTORY:  Unremarkable.   MEDICATIONS:  Birth control pill.   ALLERGIES:  CECLOR.   REVIEW OF SYSTEMS:   As per HPI.  The patient denied any chest pain,  dyspnea on exertion, syncopal episodes, nausea, vomiting, diarrhea.   OBJECTIVE:  VITAL SIGNS:  Height 60.5.  Weight 110.8.  Temperature 98.2,  pulse 78, respiratory rate 18, blood pressure 94/68.  GENERAL:  We have a pleasant female in no acute distress who answers  questions appropriately, alert and oriented x3.  She does appear  overwhelmed, as she discussed the amount of work that she has to do.  Speech is regular rate and rhythm.  Mood appropriate.   IMPRESSION:  A 20 year old with acute stress reaction leading to 2  anxiety attacks.   PLAN:  1. I had a long discussion both the Tateanna and her mother regarding      the level of external stressors that are put upon her.  I      recommended some modifications with after-school activities, and      also advised mom  and Morgan [name] to talk to the principal to see if      perhaps he would agree with modifications with the non-vital      workload.  2. I also advised mom to avoid Xanax with Morgan [name] or any other      medications unless prescribed by me or another Bellamy Judson.  3. Will refer Morgan [name] to a therapist for further treatment.  She is to      follow up with me if any concerns or if the symptoms become more      frequent.     Leanne Chang, M.D.  Electronically Signed    LA/MedQ  DD: 07/03/2006  DT: 07/04/2006  Job #: [id number]

## 2010-12-09 LAB — COMPREHENSIVE METABOLIC PANEL
ALT: 13
ALT: 14
AST: 16
AST: 18
Albumin: 3.6
Albumin: 3.7
Alkaline Phosphatase: 47
Alkaline Phosphatase: 51
BUN: 8
BUN: 7
CO2: 24
CO2: 27
Calcium: 9
Calcium: 9.7
Chloride: 110
Chloride: 107
Creatinine, Ser: 0.7
Creatinine, Ser: 0.74
GFR calc Af Amer: >60
GFR calc Af Amer: >60
GFR calc non Af Amer: >60
GFR calc non Af Amer: >60
Glucose, Bld: 85
Glucose, Bld: 96
Potassium: 3.6
Potassium: 4.1
Sodium: 140
Sodium: 140
Total Bilirubin: 0.6
Total Bilirubin: 0.5
Total Protein: 6.1
Total Protein: 6.6

## 2010-12-09 LAB — CBC
HCT: 35.8 — ABNORMAL LOW
Hemoglobin: 12.2
MCHC: 34
MCV: 84.2
Platelets: 231
RBC: 4.26
RDW: 14.4 — ABNORMAL HIGH
WBC: 6.2

## 2010-12-09 LAB — URINE MICROSCOPIC-ADD ON

## 2010-12-09 LAB — URINALYSIS, ROUTINE W REFLEX MICROSCOPIC
Bilirubin Urine: NEGATIVE
Glucose, UA: NEGATIVE
Hgb urine dipstick: NEGATIVE
Ketones, ur: NEGATIVE
Nitrite: NEGATIVE
Protein, ur: NEGATIVE
Specific Gravity, Urine: 1.015
Urobilinogen, UA: 0.2
pH: 6

## 2010-12-09 LAB — RAPID URINE DRUG SCREEN, HOSP PERFORMED
Amphetamines: NOT DETECTED
Barbiturates: NOT DETECTED
Benzodiazepines: NOT DETECTED
Cocaine: NOT DETECTED
Opiates: NOT DETECTED
Tetrahydrocannabinol: NOT DETECTED

## 2010-12-09 LAB — HCG, SERUM, QUALITATIVE: Preg, Serum: NEGATIVE

## 2010-12-09 LAB — DIFFERENTIAL
Basophils Absolute: 0
Basophils Relative: 1
Eosinophils Absolute: 0.1
Eosinophils Relative: 2
Lymphocytes Relative: 26
Lymphs Abs: 1.6
Monocytes Absolute: 0.7
Monocytes Relative: 11 — ABNORMAL HIGH
Neutro Abs: 3.8
Neutrophils Relative %: 62

## 2010-12-09 LAB — ETHANOL: Alcohol, Ethyl (B): <5

## 2010-12-09 LAB — HEMOGLOBIN AND HEMATOCRIT, BLOOD
HCT: 35.1 — ABNORMAL LOW
Hemoglobin: 11.6 — ABNORMAL LOW

## 2010-12-09 LAB — POCT PREGNANCY, URINE
Operator id: 294501
Preg Test, Ur: NEGATIVE

## 2011-04-22 ENCOUNTER — Encounter: Payer: Self-pay | Admitting: Family Medicine

## 2011-04-22 ENCOUNTER — Ambulatory Visit (INDEPENDENT_AMBULATORY_CARE_PROVIDER_SITE_OTHER): Payer: BC Managed Care – PPO | Admitting: Family Medicine

## 2011-04-22 VITALS — BP 108/66 | HR 76 | Temp 98.3°F | Wt 107.0 lb

## 2011-04-22 DIAGNOSIS — R112 Nausea with vomiting, unspecified: Secondary | ICD-10-CM

## 2011-04-22 DIAGNOSIS — J029 Acute pharyngitis, unspecified: Secondary | ICD-10-CM

## 2011-04-22 MED ORDER — PROMETHAZINE HCL 25 MG PO TABS: 25.0000 mg | ORAL_TABLET | Freq: Three times a day (TID) | ORAL | Status: AC | PRN

## 2011-04-22 MED ORDER — AMOXICILLIN 875 MG PO TABS: 875.0000 mg | ORAL_TABLET | Freq: Two times a day (BID) | ORAL | Status: AC

## 2011-04-22 NOTE — Progress Notes (Signed)
  Subjective:     Morgan [name] is a 21 y.o. female who presents for evaluation of sore throat. Associated symptoms include suspected fevers but not measured at home, chills, pain while swallowing and nausea and vomiting. Onset of symptoms was a few days ago, and have been gradually worsening since that time. She is not drinking much. She has had a recent close exposure to someone with proven streptococcal pharyngitis.  The following portions of the patient's history were reviewed and updated as appropriate: allergies, current medications, past family history, past medical history, past social history, past surgical history and problem list.  Review of Systems Pertinent items are noted in HPI.    Objective:    BP 108/66  Pulse 76  Temp(Src) 98.3 F (36.8 C) (Oral)  Wt 107 lb (48.535 kg)  SpO2 97%  LMP 04/13/2011 General appearance: alert, cooperative, appears stated age and no distress Nose: Nares normal. Septum midline. Mucosa normal. No drainage or sinus tenderness. Throat: abnormal findings: exudates present and marked oropharyngeal erythema Neck: mild anterior cervical adenopathy, supple, symmetrical, trachea midline and thyroid not enlarged, symmetric, no tenderness/mass/nodules Lungs: clear to auscultation bilaterally Abdomen: soft, non-tender; bowel sounds normal; no masses,  no organomegaly  Laboratory Strep test done. Results:negative.    Assessment:    Acute pharyngitis, likely  N/V.    Plan:    Use of OTC analgesics recommended as well as salt water gargles. Follow up as needed. thought it still might be strep ---pt refusing pcn injection  --- will give phenergan tab to take prn nausea and amoxicillin  If vomiting con't --go to ER--- she also did not want phenergan injection

## 2011-04-22 NOTE — Patient Instructions (Addendum)
Pharyngitis, Viral and Bacterial Pharyngitis is soreness (inflammation) or infection of the pharynx. It is also called a sore throat. CAUSES  Most sore throats are caused by viruses and are part of a cold. However, some sore throats are caused by strep and other bacteria. Sore throats can also be caused by post nasal drip from draining sinuses, allergies and sometimes from sleeping with an open mouth. Infectious sore throats can be spread from person to person by coughing, sneezing and sharing cups or eating utensils. TREATMENT  Sore throats that are viral usually last 3-4 days. Viral illness will get better without medications (antibiotics). Strep throat and other bacterial infections will usually begin to get better about 24-48 hours after you begin to take antibiotics. HOME CARE INSTRUCTIONS   If the caregiver feels there is a bacterial infection or if there is a positive strep test, they will prescribe an antibiotic. The full course of antibiotics must be taken. If the full course of antibiotic is not taken, you or your child may become ill again. If you or your child has strep throat and do not finish all of the medication, serious heart or kidney diseases may develop.   Drink enough water and fluids to keep your urine clear or pale yellow.   Only take over-the-counter or prescription medicines for pain, discomfort or fever as directed by your caregiver.   Get lots of rest.   Gargle with salt water ( tsp. of salt in a glass of water) as often as every 1-2 hours as you need for comfort.   Hard candies may soothe the throat if individual is not at risk for choking. Throat sprays or lozenges may also be used.  SEEK MEDICAL CARE IF:   Large, tender lumps in the neck develop.   A rash develops.   Green, yellow-brown or bloody sputum is coughed up.   Your baby is older than 3 months with a rectal temperature of 100.5 F (38.1 C) or higher for more than 1 day.  SEEK IMMEDIATE MEDICAL CARE  IF:   A stiff neck develops.   You or your child are drooling or unable to swallow liquids.   You or your child are vomiting, unable to keep medications or liquids down.   You or your child has severe pain, unrelieved with recommended medications.   You or your child are having difficulty breathing (not due to stuffy nose).   You or your child are unable to fully open your mouth.   You or your child develop redness, swelling, or severe pain anywhere on the neck.   You have a fever.   Your baby is older than 3 months with a rectal temperature of 102 F (38.9 C) or higher.   Your baby is 50 months old or younger with a rectal temperature of 100.4 F (38 C) or higher.  MAKE SURE YOU:   Understand these instructions.   Will watch your condition.   Will get help right away if you are not doing well or get worse.  Document Released: 02/14/2005 Document Revised: 10/27/2010 Document Reviewed: 05/14/2007 [facility] Patient Information 2012 [place], [place].  Nausea and Vomiting Nausea is a sick feeling that often comes before throwing up (vomiting). Vomiting is a reflex where stomach contents come out of your mouth. Vomiting can cause severe loss of body fluids (dehydration). Children and elderly adults can become dehydrated quickly, especially if they also have diarrhea. Nausea and vomiting are symptoms of a condition or disease. It is  important to find the cause of your symptoms. CAUSES   Direct irritation of the stomach lining. This irritation can result from increased acid production (gastroesophageal reflux disease), infection, food poisoning, taking certain medicines (such as nonsteroidal anti-inflammatory drugs), alcohol use, or tobacco use.   Signals from the brain.These signals could be caused by a headache, heat exposure, an inner ear disturbance, increased pressure in the brain from injury, infection, a tumor, or a concussion, pain, emotional stimulus, or metabolic problems.    An obstruction in the gastrointestinal tract (bowel obstruction).   Illnesses such as diabetes, hepatitis, gallbladder problems, appendicitis, kidney problems, cancer, sepsis, atypical symptoms of a heart attack, or eating disorders.   Medical treatments such as chemotherapy and radiation.   Receiving medicine that makes you sleep (general anesthetic) during surgery.  DIAGNOSIS Your caregiver may ask for tests to be done if the problems do not improve after a few days. Tests may also be done if symptoms are severe or if the reason for the nausea and vomiting is not clear. Tests may include:  Urine tests.   Blood tests.   Stool tests.   Cultures (to look for evidence of infection).   X-rays or other imaging studies.  Test results can help your caregiver make decisions about treatment or the need for additional tests. TREATMENT You need to stay well hydrated. Drink frequently but in small amounts.You may wish to drink water, sports drinks, clear broth, or eat frozen ice pops or gelatin dessert to help stay hydrated.When you eat, eating slowly may help prevent nausea.There are also some antinausea medicines that may help prevent nausea. HOME CARE INSTRUCTIONS   Take all medicine as directed by your caregiver.   If you do not have an appetite, do not force yourself to eat. However, you must continue to drink fluids.   If you have an appetite, eat a normal diet unless your caregiver tells you differently.   Eat a variety of complex carbohydrates (rice, wheat, potatoes, bread), lean meats, yogurt, fruits, and vegetables.   Avoid high-fat foods because they are more difficult to digest.   Drink enough water and fluids to keep your urine clear or pale yellow.   If you are dehydrated, ask your caregiver for specific rehydration instructions. Signs of dehydration may include:   Severe thirst.   Dry lips and mouth.   Dizziness.   Dark urine.   Decreasing urine frequency and  amount.   Confusion.   Rapid breathing or pulse.  SEEK IMMEDIATE MEDICAL CARE IF:   You have blood or brown flecks (like coffee grounds) in your vomit.   You have black or bloody stools.   You have a severe headache or stiff neck.   You are confused.   You have severe abdominal pain.   You have chest pain or trouble breathing.   You do not urinate at least once every 8 hours.   You develop cold or clammy skin.   You continue to vomit for longer than 24 to 48 hours.   You have a fever.  MAKE SURE YOU:   Understand these instructions.   Will watch your condition.   Will get help right away if you are not doing well or get worse.  Document Released: 02/14/2005 Document Revised: 10/27/2010 Document Reviewed: 07/14/2010 [facility] Patient Information 2012 [place], [place].

## 2011-04-22 NOTE — Progress Notes (Signed)
Addended by: Arnette Norris on: 04/22/2011 04:48 PM   Modules accepted: Orders

## 2011-04-24 LAB — CULTURE, GROUP A STREP: Organism ID, Bacteria: NORMAL

## 2016-10-25 ENCOUNTER — Ambulatory Visit (INDEPENDENT_AMBULATORY_CARE_PROVIDER_SITE_OTHER): Payer: 59

## 2016-10-25 ENCOUNTER — Ambulatory Visit (INDEPENDENT_AMBULATORY_CARE_PROVIDER_SITE_OTHER): Payer: 59 | Admitting: Orthopaedic Surgery

## 2016-10-25 ENCOUNTER — Encounter (INDEPENDENT_AMBULATORY_CARE_PROVIDER_SITE_OTHER): Payer: Self-pay | Admitting: Orthopaedic Surgery

## 2016-10-25 DIAGNOSIS — M25571 Pain in right ankle and joints of right foot: Secondary | ICD-10-CM

## 2016-10-25 MED ORDER — OXYCODONE-ACETAMINOPHEN 5-325 MG PO TABS: 1.0000 | ORAL_TABLET | ORAL | 0 refills | Status: DC | PRN

## 2016-10-25 MED ORDER — NAPROXEN 500 MG PO TABS: 500.0000 mg | ORAL_TABLET | Freq: Two times a day (BID) | ORAL | 3 refills | Status: DC

## 2016-10-25 NOTE — Progress Notes (Signed)
Office Visit Note   Patient: Morgan [name]           Date of Birth: Oct 23, 1990           MRN: [id number] Visit Date: 10/25/2016              Requested by: [address] [place], [place] [address] [place], [place] [address] PCP: Zola Button, Grayling Congress, DO   Assessment & Plan: Visit Diagnoses:  1. Pain in right ankle and joints of right foot     Plan: X-rays are negative for acute findings. I recommend Cam Walker and out of work for 2 weeks. Reevaluation at that time. She requested to be out of work for 2 months but think this is a bit excessive given that she has an ankle sprain. I think we should reassess in another 2 weeks. Percocet and naproxen were prescribed.  Weight-bear as tolerated in cam walker. Crutches as needed.  Follow-Up Instructions: Return if symptoms worsen or fail to improve.   Orders:  Orders Placed This Encounter  Procedures  . XR Ankle Complete Right   Meds ordered this encounter  Medications  . oxyCODONE-acetaminophen (PERCOCET) 5-325 MG tablet    Sig: Take 1 tablet by mouth every 4 (four) hours as needed for severe pain.    Dispense:  20 tablet    Refill:  0  . naproxen (NAPROSYN) 500 MG tablet    Sig: Take 1 tablet (500 mg total) by mouth 2 (two) times daily with a meal.    Dispense:  30 tablet    Refill:  3      Procedures: No procedures performed   Clinical Data: No additional findings.   Subjective: Chief Complaint  Patient presents with  . Right Ankle - Pain    Patient is a 26 year old female comes in with acute right ankle injury last night. She sprained her ankle and had 10 out of 10 pain. She complains of throbbing pain and swelling. She didn't wrapping it with an Ace wrap.    Review of Systems  Constitutional: Negative.   HENT: Negative.   Eyes: Negative.   Respiratory: Negative.   Cardiovascular: Negative.   Endocrine: Negative.   Musculoskeletal: Negative.   Neurological: Negative.   Hematological:  Negative.   Psychiatric/Behavioral: Negative.   All other systems reviewed and are negative.    Objective: Vital Signs: There were no vitals taken for this visit.  Physical Exam  Constitutional: She is oriented to person, place, and time. She appears well-developed and well-nourished.  Pulmonary/Chest: Effort normal.  Neurological: She is alert and oriented to person, place, and time.  Skin: Skin is warm. Capillary refill takes less than 2 seconds.  Psychiatric: She has a normal mood and affect. Her behavior is normal. Judgment and thought content normal.  Nursing note and vitals reviewed.   Ortho Exam Right ankle exam shows diffuse pain with palpation that is out of proportion with exam and clinical findings. No significant swelling. Significant guarding secondary to pain foot is warm well-perfused.. No signs of CRPS Specialty Comments:  No specialty comments available.  Imaging: Xr Ankle Complete Right  Result Date: 10/25/2016 No acute or structural findings    PMFS History: Patient Active Problem List   Diagnosis Date Noted  . THUMB PAIN, RIGHT 07/06/2009  . SINUSITIS- ACUTE-NOS 12/03/2007  . ACUTE BRONCHITIS 05/30/2007  . 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 ANXIETY 05/09/2007   Past Medical History:  Diagnosis Date  . Anxiety   . Pott's disease     Family History  Problem Relation Age of Onset  . Hypertension Unknown     No past surgical history on file. Social History   Occupational History  . Not on file.   Social History Main Topics  . Smoking status: Never Smoker  . Smokeless tobacco: Never Used  . Alcohol use No  . Drug use: No  . Sexual activity: Not on file

## 2017-06-27 ENCOUNTER — Other Ambulatory Visit: Payer: Self-pay | Admitting: Obstetrics and Gynecology

## 2017-06-27 ENCOUNTER — Ambulatory Visit
Admission: RE | Admit: 2017-06-27 | Discharge: 2017-06-27 | Disposition: A | Discharge status: Home / Self Care | Payer: Managed Care, Other (non HMO) | Source: Ambulatory Visit | Attending: Obstetrics and Gynecology | Admitting: Obstetrics and Gynecology

## 2017-06-27 DIAGNOSIS — R1030 Lower abdominal pain, unspecified: Secondary | ICD-10-CM

## 2017-06-27 MED ORDER — IOPAMIDOL (ISOVUE-300) INJECTION 61%
100.0000 mL | Freq: Once | INTRAVENOUS | Status: AC | PRN
  Administered 2017-06-27: 100 mL via INTRAVENOUS

## 2018-03-01 ENCOUNTER — Ambulatory Visit (INDEPENDENT_AMBULATORY_CARE_PROVIDER_SITE_OTHER): Payer: 59 | Admitting: Orthopedic Surgery

## 2018-03-01 ENCOUNTER — Encounter (INDEPENDENT_AMBULATORY_CARE_PROVIDER_SITE_OTHER): Payer: Self-pay | Admitting: Orthopedic Surgery

## 2018-03-01 ENCOUNTER — Ambulatory Visit (INDEPENDENT_AMBULATORY_CARE_PROVIDER_SITE_OTHER): Payer: Self-pay

## 2018-03-01 VITALS — Wt 107.0 lb

## 2018-03-01 DIAGNOSIS — M4302 Spondylolysis, cervical region: Secondary | ICD-10-CM

## 2018-03-01 MED ORDER — METHOCARBAMOL 500 MG PO TABS: 500.0000 mg | ORAL_TABLET | Freq: Four times a day (QID) | ORAL | 0 refills | Status: DC | PRN

## 2018-03-01 MED ORDER — PREDNISONE 10 MG PO TABS: 20.0000 mg | ORAL_TABLET | Freq: Every day | ORAL | 0 refills | Status: DC

## 2018-03-06 ENCOUNTER — Encounter (INDEPENDENT_AMBULATORY_CARE_PROVIDER_SITE_OTHER): Payer: Self-pay | Admitting: Orthopedic Surgery

## 2018-03-06 NOTE — Progress Notes (Signed)
Office Visit Note   Patient: Morgan [name]           Date of Birth: 1990-07-29           MRN: [id number] Visit Date: 03/01/2018              Requested by: [address] [place], [place] [address] [place], [place] [address] PCP: Zola Button, Grayling Congress, DO  Chief Complaint  Patient presents with  . Spine - Pain      HPI: Patient is a 28 year old woman who complains of pain from her neck radiating to the left scapular border.  Patient states the pain is been worse over the past few days she is tried icy hot without relief she states she occasionally has pain anteriorly over her ribs.  Patient states she has numbness and tingling going down the left arm.  Patient denies any shortness of breath.  Assessment & Plan: Visit Diagnoses:  1. Spondylolysis, cervical region     Plan: Recommended heat recommended a prednisone Dosepak as well as Robaxin.  Will reevaluate in the office.  Follow-Up Instructions: Return in about 4 weeks (around 03/29/2018), or if symptoms worsen or fail to improve.   Ortho Exam  Patient is alert, oriented, no adenopathy, well-dressed, normal affect, normal respiratory effort. Examination patient has thoracic outlet tenderness on the left.  She has decreased range of motion of her cervical spine and has tenderness to palpation of the cervical's paraspinous muscles she is also tender to palpation of the medial scapular border on the left.  She has no focal motor weakness in either upper extremity but manipulation of her neck reproduces all of her symptoms.  Subjectively she has numbness in the left upper extremity.  Imaging: No results found. No images are attached to the encounter.  Labs: Lab Results  Component Value Date   LABORGA Normal Upper Respiratory Flora 04/22/2011   LABORGA No Beta Hemolytic Streptococci Isolated 04/22/2011     Lab Results  Component Value Date   ALBUMIN 3.6 12/04/2006   ALBUMIN 3.7 11/21/2006    There is no  height or weight on file to calculate BMI.  Orders:  Orders Placed This Encounter  Procedures  . XR Cervical Spine 2 or 3 views   Meds ordered this encounter  Medications  . methocarbamol (ROBAXIN) 500 MG tablet    Sig: Take 1 tablet (500 mg total) by mouth every 6 (six) hours as needed for muscle spasms.    Dispense:  30 tablet    Refill:  0  . predniSONE (DELTASONE) 10 MG tablet    Sig: Take 2 tablets (20 mg total) by mouth daily with breakfast.    Dispense:  60 tablet    Refill:  0     Procedures: No procedures performed  Clinical Data: No additional findings.  ROS:  All other systems negative, except as noted in the HPI. Review of Systems  Objective: Vital Signs: Wt 107 lb (48.5 kg)   Specialty Comments:  No specialty comments available.  PMFS History: Patient Active Problem List   Diagnosis Date Noted  . THUMB PAIN, RIGHT 07/06/2009  . SINUSITIS- ACUTE-NOS 12/03/2007  . ACUTE BRONCHITIS 05/30/2007  . 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 ANXIETY 05/09/2007   Past Medical History:  Diagnosis Date  . Anxiety   . Pott's disease     Family History  Problem Relation Age of Onset  . Hypertension Unknown  History reviewed. No pertinent surgical history. Social History   Occupational History  . Not on file  Tobacco Use  . Smoking status: Never Smoker  . Smokeless tobacco: Never Used  Substance and Sexual Activity  . Alcohol use: No  . Drug use: No  . Sexual activity: Not on file

## 2018-03-07 ENCOUNTER — Other Ambulatory Visit (INDEPENDENT_AMBULATORY_CARE_PROVIDER_SITE_OTHER): Payer: Self-pay | Admitting: Family

## 2018-03-07 ENCOUNTER — Telehealth (INDEPENDENT_AMBULATORY_CARE_PROVIDER_SITE_OTHER): Payer: Self-pay

## 2018-03-07 MED ORDER — NABUMETONE 500 MG PO TABS: 500.0000 mg | ORAL_TABLET | Freq: Every day | ORAL | 0 refills | Status: DC

## 2018-03-07 NOTE — Telephone Encounter (Signed)
Patient called concerning continuation of Prednisone due to thinking about death.  Talked with Dossie Arbour. NP that's works with Dr. Lajoyce Corners and she talked with patient advised her to discontinue the Prednisone.

## 2018-11-29 ENCOUNTER — Ambulatory Visit: Payer: Managed Care, Other (non HMO) | Admitting: Family Medicine

## 2018-12-07 ENCOUNTER — Ambulatory Visit: Payer: Managed Care, Other (non HMO) | Admitting: Family Medicine

## 2019-01-02 ENCOUNTER — Other Ambulatory Visit: Payer: Self-pay

## 2019-01-02 DIAGNOSIS — Z20822 Contact with and (suspected) exposure to covid-19: Secondary | ICD-10-CM

## 2019-01-03 LAB — NOVEL CORONAVIRUS, NAA: SARS-CoV-2, NAA: NOT DETECTED

## 2019-02-06 ENCOUNTER — Telehealth: Payer: Self-pay | Admitting: Family Medicine

## 2019-02-06 NOTE — Telephone Encounter (Signed)
That is fine, we can do this.  Orma Flaming, MD [facility]

## 2019-02-06 NOTE — Telephone Encounter (Signed)
Please advise if patient can have a vv since she has covid.   Copied from [place] [id number]. Topic: General - Other >> Feb 06, 2019 12:46 PM Leward Quan A wrote: Reason for CRM: Patient called to say that she tested positive for covid and would like her visit to be done virtually by Dr Rogers Blocker so she dont have to cancel again. Please call patient at Ph# [phone]

## 2019-02-06 NOTE — Telephone Encounter (Signed)
LVM FOR PATIENT TO CALL BACK AND GET HER SET UP FOR A VV WITH DR. Rogers Blocker AND TO SEE IF SHE LIKED TO BE SEEN ON 02/07/19 SINCE DR [place] DOES VV ALL DAY ON THURSDAYS.

## 2019-02-07 ENCOUNTER — Ambulatory Visit (INDEPENDENT_AMBULATORY_CARE_PROVIDER_SITE_OTHER): Payer: 59 | Admitting: Family Medicine

## 2019-02-07 ENCOUNTER — Encounter: Payer: Self-pay | Admitting: Family Medicine

## 2019-02-07 VITALS — Ht 60.0 in | Wt 115.0 lb

## 2019-02-07 DIAGNOSIS — G90A Postural orthostatic tachycardia syndrome (POTS): Secondary | ICD-10-CM | POA: Insufficient documentation

## 2019-02-07 DIAGNOSIS — Z0000 Encounter for general adult medical examination without abnormal findings: Secondary | ICD-10-CM | POA: Diagnosis not present

## 2019-02-07 DIAGNOSIS — Z8349 Family history of other endocrine, nutritional and metabolic diseases: Secondary | ICD-10-CM | POA: Insufficient documentation

## 2019-02-07 DIAGNOSIS — Z8041 Family history of malignant neoplasm of ovary: Secondary | ICD-10-CM | POA: Insufficient documentation

## 2019-02-07 DIAGNOSIS — I498 Other specified cardiac arrhythmias: Secondary | ICD-10-CM | POA: Insufficient documentation

## 2019-02-07 NOTE — Progress Notes (Signed)
Patient: Morgan [name] MRN: [id number] DOB: Jul 08, 1990 PCP: No primary care provider on file.    I connected with  Morgan [name] on 02/07/19 by a video enabled telemedicine application at 3:23FT and verified that I am speaking with the correct person using two identifiers.   I discussed the limitations of evaluation and management by telemedicine. The patient expressed understanding and agreed to proceed. People involved: [name] and Dr. Rogers Blocker    Subjective:  Chief Complaint  Patient presents with  . Establish Care    HPI: The patient is a 28 y.o. female who presents today for annual exam. She denies any changes to past medical history. There have been no recent hospitalizations. They are following a well balanced diet and exercise plan. Weight has been stable. No complaints today. Tested for positive for covid last week. Asymptomatic and has been quarantining.   No family history of breast cancer. Sister had ovarian cancer (7 years of age). She has been tested for BRCA tested negative.   No hx of colon cancer  Immunization History  Administered Date(s) Administered  . Tdap 09/29/2018    Pap smear: 09/2018. Normal (dr. Milta Deiters) tdap has had done.  hiv done   Review of Systems  Constitutional: Positive for fatigue. Negative for chills and fever.  HENT: Negative for dental problem, ear pain, hearing loss and trouble swallowing.   Eyes: Negative for visual disturbance.  Respiratory: Negative for cough, chest tightness and shortness of breath.   Cardiovascular: Positive for leg swelling. Negative for chest pain and palpitations.       C/o b/l swollen ankles.    Gastrointestinal: Negative for abdominal pain, blood in stool, constipation, diarrhea, nausea and vomiting.  Endocrine: Negative for cold intolerance, heat intolerance, polydipsia, polyphagia and polyuria.  Genitourinary: Negative for dysuria, frequency, hematuria and urgency.  Musculoskeletal: Negative for  arthralgias, back pain and neck pain.  Skin: Negative for rash.  Neurological: Negative for dizziness and headaches.  Psychiatric/Behavioral: Negative for dysphoric mood and sleep disturbance. The patient is not nervous/anxious.     Allergies Patient is allergic to cefaclor.  Past Medical History Patient  has a past medical history of Anxiety and Pott's disease.  Surgical History Patient  has no past surgical history on file.  Family History Pateint's family history includes Hypertension in her unknown relative.  Social History Patient  reports that she has never smoked. She has never used smokeless tobacco. She reports that she does not drink alcohol or use drugs.    Objective: Vitals:   02/07/19 1356  Weight: 115 lb (52.2 kg)  Height: 5' (1.524 m)    Body mass index is 22.46 kg/m.  Physical Exam Vitals reviewed.  Constitutional:      Appearance: Normal appearance. She is normal weight.  HENT:     Head: Normocephalic and atraumatic.  Pulmonary:     Effort: Pulmonary effort is normal.  Neurological:     General: No focal deficit present.     Mental Status: She is alert and oriented to person, place, and time.  Psychiatric:        Mood and Affect: Mood normal.        Behavior: Behavior normal.      Office Visit from 02/07/2019 in [facility]  PHQ-2 Total Score  0          Assessment/plan: 1. Annual physical exam Continue healthy diet and lifestyle. UTD on health maintenance. . Will need to get records from gyn.  Will come in for labs and be fasting. F/u in oone year or as needed.  Patient counseling '[x]'    Nutrition: Stressed importance of moderation in sodium/caffeine intake, saturated fat and cholesterol, caloric balance, sufficient intake of fresh fruits, vegetables, fiber, calcium, iron, and 1 mg of folate supplement per day (for females capable of pregnancy).  '[x]'    Stressed the importance of regular exercise.   '[]'    Substance  Abuse: Discussed cessation/primary prevention of tobacco, alcohol, or other drug use; driving or other dangerous activities under the influence; availability of treatment for abuse.   '[x]'    Injury prevention: Discussed safety belts, safety helmets, smoke detector, smoking near bedding or upholstery.   '[x]'    Sexuality: Discussed sexually transmitted diseases, partner selection, use of condoms, avoidance of unintended pregnancy  and contraceptive alternatives.  '[x]'    Dental health: Discussed importance of regular tooth brushing, flossing, and dental visits.  '[x]'    Health maintenance and immunizations reviewed. Please refer to Health maintenance section.    - Comprehensive metabolic panel; Future - CBC with Differential/Platelet; Future - Lipid panel; Future - TSH; Future - VITAMIN D 25 Hydroxy (Vit-D Deficiency, Fractures); Future     Return in about 1 year (around 02/07/2020).     Orma Flaming, MD [facility]  02/07/2019

## 2019-02-08 ENCOUNTER — Ambulatory Visit: Payer: Self-pay | Admitting: Family Medicine

## 2019-04-28 ENCOUNTER — Ambulatory Visit: Payer: Self-pay | Attending: Internal Medicine

## 2019-04-28 DIAGNOSIS — Z23 Encounter for immunization: Secondary | ICD-10-CM | POA: Insufficient documentation

## 2019-04-28 NOTE — Progress Notes (Signed)
   Covid-19 Vaccination Clinic  Name:  Morgan [name]    MRN: [id number] DOB: [birth year]  04/28/2019  Ms. Morgan [name] was observed post Covid-19 immunization for 30 minutes based on pre-vaccination screening without incidence. She was provided with Vaccine Information Sheet and instruction to access the V-Safe system.   Ms. Morgan [name] was instructed to call 911 with any severe reactions post vaccine: Marland Kitchen Difficulty breathing  . Swelling of your face and throat  . A fast heartbeat  . A bad rash all over your body  . Dizziness and weakness    Immunizations Administered    Name Date Dose VIS Date Route   Pfizer COVID-19 Vaccine 04/28/2019  1:53 PM 0.3 mL 02/08/2019 Intramuscular   Manufacturer: [employer], [employer]   Lot: [id number]   NDC: [address]-8

## 2019-05-10 ENCOUNTER — Ambulatory Visit: Payer: 59 | Attending: Internal Medicine

## 2019-05-10 ENCOUNTER — Other Ambulatory Visit: Payer: Self-pay

## 2019-05-10 DIAGNOSIS — Z20822 Contact with and (suspected) exposure to covid-19: Secondary | ICD-10-CM | POA: Insufficient documentation

## 2019-05-11 LAB — NOVEL CORONAVIRUS, NAA: SARS-CoV-2, NAA: NOT DETECTED

## 2019-05-28 ENCOUNTER — Ambulatory Visit: Payer: Self-pay | Attending: Internal Medicine

## 2019-05-28 DIAGNOSIS — Z23 Encounter for immunization: Secondary | ICD-10-CM

## 2019-05-28 NOTE — Progress Notes (Signed)
   Covid-19 Vaccination Clinic  Name:  Morgan [name]    MRN: [id number] DOB: Oct 25, 1990  05/28/2019  Ms. [name] was observed post Covid-19 immunization for 15 minutes without incident. She was provided with Vaccine Information Sheet and instruction to access the V-Safe system.   Ms. [name] was instructed to call 911 with any severe reactions post vaccine: Marland Kitchen Difficulty breathing  . Swelling of face and throat  . A fast heartbeat  . A bad rash all over body  . Dizziness and weakness   Immunizations Administered    Name Date Dose VIS Date Route   Pfizer COVID-19 Vaccine 05/28/2019 10:44 AM 0.3 mL 02/08/2019 Intramuscular   Manufacturer: [employer], [employer]   Lot: [id number]   NDC: [address]-7

## 2019-07-22 ENCOUNTER — Other Ambulatory Visit: Payer: Self-pay

## 2019-07-22 ENCOUNTER — Telehealth: Payer: Self-pay | Admitting: Family Medicine

## 2019-07-22 ENCOUNTER — Ambulatory Visit (INDEPENDENT_AMBULATORY_CARE_PROVIDER_SITE_OTHER): Payer: 59 | Admitting: Family Medicine

## 2019-07-22 ENCOUNTER — Encounter: Payer: Self-pay | Admitting: Family Medicine

## 2019-07-22 VITALS — BP 122/80 | HR 85 | Temp 98.5°F | Ht 60.0 in | Wt 116.4 lb

## 2019-07-22 DIAGNOSIS — Z0000 Encounter for general adult medical examination without abnormal findings: Secondary | ICD-10-CM | POA: Diagnosis not present

## 2019-07-22 DIAGNOSIS — F411 Generalized anxiety disorder: Secondary | ICD-10-CM | POA: Diagnosis not present

## 2019-07-22 DIAGNOSIS — R55 Syncope and collapse: Secondary | ICD-10-CM | POA: Diagnosis not present

## 2019-07-22 DIAGNOSIS — Z114 Encounter for screening for human immunodeficiency virus [HIV]: Secondary | ICD-10-CM | POA: Diagnosis not present

## 2019-07-22 MED ORDER — HYDROXYZINE HCL 25 MG PO TABS: 25.0000 mg | ORAL_TABLET | Freq: Three times a day (TID) | ORAL | 0 refills | Status: DC | PRN

## 2019-07-22 NOTE — Telephone Encounter (Signed)
Pt is scheduled with Dr Artis Flock today @ 2:40p

## 2019-07-22 NOTE — Patient Instructions (Signed)
1) I think counseling would be great for you. Giving you lisa's card 2) you already are exercising which is great 3) we are going to start you on a prn medication called hydroxyzine. You take this as needed for anxiety up to three times a day.  4) let me know if you are not doing well and need a daily medication. Just email me and then we can follow up.   Labs today as well!   Talk soon, enjoy your summer  Dr. Artis Flock

## 2019-07-22 NOTE — Telephone Encounter (Signed)
Nurse Assessment Nurse: Yetta Barre, RN, French Ana Date/Time (Eastern Time): 07/22/2019 11:45:50 AM Confirm and document reason for call. If symptomatic, describe symptoms. ---Caller states that she passed out at work, has POTS and vagal syncope. Slight headache but didn't injury herself. Has the patient had close contact with a person known or suspected to have the novel coronavirus illness OR traveled / lives in area with major community spread (including international travel) in the last 14 days from the onset of symptoms? * If Asymptomatic, screen for exposure and travel within the last 14 days. ---No Does the patient have any new or worsening symptoms? ---Yes Will a triage be completed? ---Yes Related visit to physician within the last 2 weeks? ---No Does the PT have any chronic conditions? (i.e. diabetes, asthma, this includes High risk factors for pregnancy, etc.) ---Yes List chronic conditions. ---POTS, vagal syncope Is the patient pregnant or possibly pregnant? (Ask all females between the ages of 23-55) ---No Is this a behavioral health or substance abuse call? ---No Guidelines Guideline Title Affirmed Question Affirmed Notes Nurse Date/Time (Eastern Time) Fainting Simple fainting is a chronic symptom (has occurred multiple times) Yetta Barre, RN, French Ana 07/22/2019 11:48:40 AMPLEASE NOTE: All timestamps contained within this report are represented as [place] Standard Time. CONFIDENTIALTY NOTICE: This fax transmission is intended only for the addressee. It contains information that is legally privileged, confidential or otherwise protected from use or disclosure. If you are not the intended recipient, you are strictly prohibited from reviewing, disclosing, copying using or disseminating any of this information or taking any action in reliance on or regarding this information. If you have received this fax in error, please notify us immediately by telephone so that we can arrange for its return  to [place]. Phone: [phone], Toll-Free: [phone], Fax: [phone] Page: 2 of 2 Call Id: [id number] Disp. Time Lamount Cohen Time) Disposition Final User 07/22/2019 11:26:21 AM Send to Urgent Gilford Silvius 07/22/2019 11:31:12 AM Attempt made - no message left Johnnette Gourd 07/22/2019 11:55:54 AM See PCP within 2 Weeks Yes Yetta Barre, RN, Carmin Muskrat Disagree/Comply Comply Caller Understands Yes PreDisposition Call Doctor Care Advice Given Per Guideline SEE PCP WITHIN 2 WEEKS: * You need to be seen for this ongoing problem within the next 2 weeks. Call your doctor (or NP/PA) during regular office hours and make an appointment. CARE ADVICE given per Fainting (Adult) guideline. * You become worse. CALL BACK IF: PREGNANCY TEST, WHEN IN DOUBT: * You can buy a pregnancy test at the drugstore. * It works best if you test your first urine in the morning. * If there is a chance that you might be pregnant, use a urine pregnancy test. * Call back if you are pregnant

## 2019-07-22 NOTE — Progress Notes (Signed)
Patient: Morgan [name] MRN: [id number] DOB: October 17, 1990 PCP: Orma Flaming, MD     Subjective:  Chief Complaint  Patient presents with  . Loss of Consciousness    HPI: The patient is a 29 y.o. female who presents today for syncope. She says that she had an issue of syncope today at work. She has a history of POTS+vasovagal and is not concerned about the passing out per say, but she is overdue for her lab work and would like to do this today. She states she was talking to her mom and had a heated conversation. She had been standing for long periods of time and doesn't sit at work then passed out. Similar to pass episodes of this with POTS. She did not urinate on herself, bite her tongue, no rigidity. She was out for under 5 minutes. She is asymptomatic. Her fiance is also worried because these episodes happen when she is under high stress or anxiety. She also agrees in the last 10 years it has been anxiety related. She has been on medication in the past for anxiety. She remembers lexapro and xanax. The last time she was on medication was 9 years ago. She feels like she doesn't need a daily medication at this point, more PRN. She states she has had only 3-4 episodes in the past year. She is also open to counseling.   She would like to get her annual labs done as well.   Review of Systems  Respiratory: Negative for cough, shortness of breath and wheezing.   Cardiovascular: Negative for chest pain and palpitations.  Neurological: Positive for syncope. Negative for dizziness, light-headedness and headaches.  Psychiatric/Behavioral: The patient is nervous/anxious.     Allergies Patient is allergic to cefaclor.  Past Medical History Patient  has a past medical history of Anxiety and Pott's disease.  Surgical History Patient  has no past surgical history on file.  Family History Pateint's family history includes Hypertension in her unknown relative.  Social History Patient  reports  that she has never smoked. She has never used smokeless tobacco. She reports that she does not drink alcohol or use drugs.    Objective: Vitals:   07/22/19 1455  BP: 122/80  Pulse: 85  Temp: 98.5 F (36.9 C)  TempSrc: Temporal  SpO2: 97%  Weight: 116 lb 6.4 oz (52.8 kg)  Height: 5' (1.524 m)    Body mass index is 22.73 kg/m.  Physical Exam Vitals reviewed.  Constitutional:      Appearance: Normal appearance. She is well-developed and normal weight.  HENT:     Head: Normocephalic and atraumatic.     Right Ear: Tympanic membrane, ear canal and external ear normal.     Left Ear: Tympanic membrane, ear canal and external ear normal.     Nose: Nose normal.     Mouth/Throat:     Mouth: Mucous membranes are moist.  Eyes:     Extraocular Movements: Extraocular movements intact.     Conjunctiva/sclera: Conjunctivae normal.     Pupils: Pupils are equal, round, and reactive to light.  Neck:     Thyroid: No thyromegaly.  Cardiovascular:     Rate and Rhythm: Normal rate and regular rhythm.     Pulses: Normal pulses.     Heart sounds: Normal heart sounds. No murmur.  Pulmonary:     Effort: Pulmonary effort is normal.     Breath sounds: Normal breath sounds.  Abdominal:     General: Abdomen is flat. Bowel  sounds are normal. There is no distension.     Palpations: Abdomen is soft.     Tenderness: There is no abdominal tenderness.  Musculoskeletal:     Cervical back: Normal range of motion and neck supple.  Lymphadenopathy:     Cervical: No cervical adenopathy.  Skin:    General: Skin is warm and dry.     Capillary Refill: Capillary refill takes less than 2 seconds.     Findings: No rash.  Neurological:     General: No focal deficit present.     Mental Status: She is alert and oriented to person, place, and time.     Cranial Nerves: No cranial nerve deficit.     Sensory: No sensory deficit.     Motor: No weakness.     Coordination: Coordination normal.     Gait: Gait  normal.     Deep Tendon Reflexes: Reflexes normal.  Psychiatric:        Mood and Affect: Mood normal.        Behavior: Behavior normal.    GAD 7 : Generalized Anxiety Score 07/22/2019  Nervous, Anxious, on Edge 2  Control/stop worrying 2  Worry too much - different things 2  Trouble relaxing 2  Restless 1  Easily annoyed or irritable 0  Afraid - awful might happen 2  Total GAD 7 Score 11  Anxiety Difficulty Somewhat difficult         Assessment/plan: 1. Annual physical exam Routine labs today. She is not fasting, but had her annual on virtual due to having covid.  - CBC with Differential/Platelet - Comprehensive metabolic panel - TSH - VITAMIN D 25 Hydroxy (Vit-D Deficiency, Fractures) - Lipid panel  2. Syncope and collapse Vasovagal vs. POTS. Long history of this. Will check labs. Declines EKG. She states typical episode, but concerned it's exacerbated by anxiety. See below.   3. GAD (generalized anxiety disorder) gad7 score is mild-moderate. Happens only 3-4x/year and she doesn't feel like she needs a daily medication. We are going to try counseling first. She already exercises and a PRN medication. Her fiance is here and he will also make sure she is not having daily anxiety that would require a daily medication. Im on board for conservative therapy with prn. Discussed side effects of hydroxyzine. She will let me know if she feels like it's affecting her daily.   This visit occurred during the SARS-CoV-2 public health emergency.  Safety protocols were in place, including screening questions prior to the visit, additional usage of staff PPE, and extensive cleaning of exam room while observing appropriate contact time as indicated for disinfecting solutions.    Return if symptoms worsen or fail to improve.  Orland Mustard, MD [place] [facility] [place] [facility]   07/22/2019

## 2019-07-22 NOTE — Telephone Encounter (Signed)
Patient is scheduled for today at 2:40pm  Chief Complaint FAINTING or PASSING OUT Reason for Call Symptomatic / Request for Health Information Initial Comment Caller states she is from [place] and has a Pt that needs to be triaged but she could not transfer the Pt but had Pt info. Caller states th Pt passed out a work and she did not give any other symptoms. Translation No Nurse Assessment Nurse: Yetta Barre, RN, French Ana Date/Time (Eastern Time): 07/22/2019 11:45:50 AM Confirm and document reason for call. If symptomatic, describe symptoms. ---Caller states that she passed out at work, has POTS and vagal syncope. Slight headache but didn't injury herself. Has the patient had close contact with a person known or suspected to have the novel coronavirus illness OR traveled / lives in area with major community spread (including international travel) in the last 14 days from the onset of symptoms? * If Asymptomatic, screen for exposure and travel within the last 14 days. ---No Does the patient have any new or worsening symptoms? ---Yes Will a triage be completed? ---Yes Related visit to physician within the last 2 weeks? ---No Does the PT have any chronic conditions? (i.e. diabetes, asthma, this includes High risk factors for pregnancy, etc.) ---Yes List chronic conditions. ---POTS, vagal syncope Is the patient pregnant or possibly pregnant? (Ask all females between the ages of 28-55) ---No Is this a behavioral health or substance abuse call? ---No Guidelines Guideline Title Affirmed Question Affirmed Notes Nurse Date/Time (Eastern Time) Fainting Simple fainting is a chronic symptom (has occurred multiple times) Yetta Barre, RN, French Ana 07/22/2019 11:48:40 AMPLEASE NOTE: All timestamps contained within this report are represented as [place] Standard Time. CONFIDENTIALTY NOTICE: This fax transmission is intended only for the addressee. It contains information that is legally privileged,  confidential or otherwise protected from use or disclosure. If you are not the intended recipient, you are strictly prohibited from reviewing, disclosing, copying using or disseminating any of this information or taking any action in reliance on or regarding this information. If you have received this fax in error, please notify us immediately by telephone so that we can arrange for its return to [place]. Phone: [phone], Toll-Free: [phone], Fax: [phone] Page: 2 of 2 Call Id: [id number] Disp. Time Lamount Cohen Time) Disposition Final User 07/22/2019 11:26:21 AM Send to Urgent Gilford Silvius 07/22/2019 11:31:12 AM Attempt made - no message left Johnnette Gourd 07/22/2019 11:55:54 AM See PCP within 2 Weeks Yes Yetta Barre, RN, Carmin Muskrat Disagree/Comply Comply Caller Understands Yes PreDisposition Call Doctor Care Advice Given Per Guideline SEE PCP WITHIN 2 WEEKS: * You need to be seen for this ongoing problem within the next 2 weeks. Call your doctor (or NP/PA) during regular office hours and make an appointment. CARE ADVICE given per Fainting (Adult) guideline. * You become worse. CALL BACK IF: PREGNANCY TEST, WHEN IN DOUBT: * You can buy a pregnancy test at the drugstore. * It works best if you test your first urine in the morning. * If there is a chance that you might be pregnant, use a urine pregnancy test. * Call back if you are pregnant Referrals Warm transfer to [facility]

## 2019-07-22 NOTE — Telephone Encounter (Signed)
Pt called stating she passed out at work this morning. Pt asked what she should do. Transferred pt to [facility] to be triaged by a nurse.

## 2019-07-23 LAB — LIPID PANEL
Cholesterol: 219 mg/dL — ABNORMAL HIGH (ref 0–200)
HDL: 97.3 mg/dL (ref >39.00)
LDL Cholesterol: 93 mg/dL (ref 0–99)
NonHDL: 121.44
Total CHOL/HDL Ratio: 2
Triglycerides: 142 mg/dL (ref 0.0–149.0)
VLDL: 28.4 mg/dL (ref 0.0–40.0)

## 2019-07-23 LAB — COMPREHENSIVE METABOLIC PANEL
ALT: 16 U/L (ref 0–35)
AST: 17 U/L (ref 0–37)
Albumin: 4.7 g/dL (ref 3.5–5.2)
Alkaline Phosphatase: 38 U/L — ABNORMAL LOW (ref 39–117)
BUN: 12 mg/dL (ref 6–23)
CO2: 27 mEq/L (ref 19–32)
Calcium: 9.9 mg/dL (ref 8.4–10.5)
Chloride: 103 mEq/L (ref 96–112)
Creatinine, Ser: 0.73 mg/dL (ref 0.40–1.20)
GFR: 93.13 mL/min (ref >60.00)
Glucose, Bld: 99 mg/dL (ref 70–99)
Potassium: 4.2 mEq/L (ref 3.5–5.1)
Sodium: 139 mEq/L (ref 135–145)
Total Bilirubin: 0.5 mg/dL (ref 0.2–1.2)
Total Protein: 7.1 g/dL (ref 6.0–8.3)

## 2019-07-23 LAB — CBC WITH DIFFERENTIAL/PLATELET
Basophils Absolute: 0.1 10*3/uL (ref 0.0–0.1)
Basophils Relative: 1.2 % (ref 0.0–3.0)
Eosinophils Absolute: 0 10*3/uL (ref 0.0–0.7)
Eosinophils Relative: 0.3 % (ref 0.0–5.0)
HCT: 38 % (ref 36.0–46.0)
Hemoglobin: 12.8 g/dL (ref 12.0–15.0)
Lymphocytes Relative: 26 % (ref 12.0–46.0)
Lymphs Abs: 1.9 10*3/uL (ref 0.7–4.0)
MCHC: 33.7 g/dL (ref 30.0–36.0)
MCV: 99.4 fl (ref 78.0–100.0)
Monocytes Absolute: 0.6 10*3/uL (ref 0.1–1.0)
Monocytes Relative: 8.7 % (ref 3.0–12.0)
Neutro Abs: 4.7 10*3/uL (ref 1.4–7.7)
Neutrophils Relative %: 63.8 % (ref 43.0–77.0)
Platelets: 221 10*3/uL (ref 150.0–400.0)
RBC: 3.83 Mil/uL — ABNORMAL LOW (ref 3.87–5.11)
RDW: 13.2 % (ref 11.5–15.5)
WBC: 7.3 10*3/uL (ref 4.0–10.5)

## 2019-07-23 LAB — VITAMIN D 25 HYDROXY (VIT D DEFICIENCY, FRACTURES): VITD: 29.78 ng/mL — ABNORMAL LOW (ref 30.00–100.00)

## 2019-07-23 LAB — TSH: TSH: 6.18 u[IU]/mL — ABNORMAL HIGH (ref 0.35–4.50)

## 2019-07-24 ENCOUNTER — Other Ambulatory Visit: Payer: Self-pay | Admitting: Family Medicine

## 2019-07-24 DIAGNOSIS — E038 Other specified hypothyroidism: Secondary | ICD-10-CM

## 2019-08-21 ENCOUNTER — Other Ambulatory Visit: Payer: Self-pay

## 2019-08-21 ENCOUNTER — Ambulatory Visit (INDEPENDENT_AMBULATORY_CARE_PROVIDER_SITE_OTHER): Payer: 59 | Admitting: Family Medicine

## 2019-08-21 ENCOUNTER — Encounter: Payer: Self-pay | Admitting: Family Medicine

## 2019-08-21 VITALS — BP 112/80 | HR 67 | Temp 98.4°F | Ht 60.0 in | Wt 114.8 lb

## 2019-08-21 DIAGNOSIS — R590 Localized enlarged lymph nodes: Secondary | ICD-10-CM | POA: Diagnosis not present

## 2019-08-21 DIAGNOSIS — Z1159 Encounter for screening for other viral diseases: Secondary | ICD-10-CM | POA: Diagnosis not present

## 2019-08-21 DIAGNOSIS — E038 Other specified hypothyroidism: Secondary | ICD-10-CM | POA: Diagnosis not present

## 2019-08-21 DIAGNOSIS — Z114 Encounter for screening for human immunodeficiency virus [HIV]: Secondary | ICD-10-CM

## 2019-08-21 LAB — CBC WITH DIFFERENTIAL/PLATELET
Basophils Absolute: 0.1 10*3/uL (ref 0.0–0.1)
Basophils Relative: 1 % (ref 0.0–3.0)
Eosinophils Absolute: 0.1 10*3/uL (ref 0.0–0.7)
Eosinophils Relative: 1.7 % (ref 0.0–5.0)
HCT: 39.4 % (ref 36.0–46.0)
Hemoglobin: 13.3 g/dL (ref 12.0–15.0)
Lymphocytes Relative: 41.9 % (ref 12.0–46.0)
Lymphs Abs: 2.4 10*3/uL (ref 0.7–4.0)
MCHC: 33.8 g/dL (ref 30.0–36.0)
MCV: 99.4 fl (ref 78.0–100.0)
Monocytes Absolute: 0.5 10*3/uL (ref 0.1–1.0)
Monocytes Relative: 9.4 % (ref 3.0–12.0)
Neutro Abs: 2.6 10*3/uL (ref 1.4–7.7)
Neutrophils Relative %: 46 % (ref 43.0–77.0)
Platelets: 212 10*3/uL (ref 150.0–400.0)
RBC: 3.96 Mil/uL (ref 3.87–5.11)
RDW: 13.1 % (ref 11.5–15.5)
WBC: 5.6 10*3/uL (ref 4.0–10.5)

## 2019-08-21 LAB — TSH: TSH: 2.29 u[IU]/mL (ref 0.35–4.50)

## 2019-08-21 LAB — T4, FREE: Free T4: 0.61 ng/dL (ref 0.60–1.60)

## 2019-08-21 NOTE — Patient Instructions (Signed)
Your imaging referral has been sent to [facility].  Their phone number is [phone].  Please wait 2 business days before calling them to schedule your appointment.   ultrasound area and then will go from there. I suspect either sebaceous cyst or lymph node.   Good to see you!  Dr. Artis Flock

## 2019-08-21 NOTE — Progress Notes (Signed)
Patient: Morgan [name] MRN: [id number] DOB: 1991-01-20 PCP: Orland Mustard, MD     Subjective:  Chief Complaint  Patient presents with  . Mass    Located on the back of her neck.    HPI: The patient is a 29 y.o. female who presents today for a soft lump on the back of her neck. She first noticed this around end of May. It first felt like a pea, but now feels like a gnocchi.  She first thought it was an ingrown hair, but someone at work who is a doctor, thought it might be an enlarged lymph node. She saw the doctor at work and they recommended she come be seen. It has grown and is now tender to touch. She can't really turn her head to the left due to the pain. Unsure if due to lump on her neck. She denies any fever/chills. No easy bruising/bleeding. No weight loss, no blood in stool.  -did get covid vaccine at end of march.   Review of Systems  Constitutional: Negative for chills, diaphoresis, fatigue, fever and unexpected weight change.  Respiratory: Negative for shortness of breath and wheezing.   Gastrointestinal: Negative for abdominal pain, nausea and vomiting.  Musculoskeletal: Negative for arthralgias and back pain.  Neurological: Negative for dizziness, light-headedness and headaches.  Hematological: Does not bruise/bleed easily.  Psychiatric/Behavioral: Negative for confusion.    Allergies Patient is allergic to cefaclor.  Past Medical History Patient  has a past medical history of Anxiety and Pott's disease.  Surgical History Patient  has no past surgical history on file.  Family History Pateint's family history includes Hypertension in her unknown relative.  Social History Patient  reports that she has never smoked. She has never used smokeless tobacco. She reports that she does not drink alcohol and does not use drugs.    Objective: Vitals:   08/21/19 0933  BP: 112/80  Pulse: 67  Temp: 98.4 F (36.9 C)  TempSrc: Temporal  SpO2: 94%  Weight: 114 lb 12.8  oz (52.1 kg)  Height: 5' (1.524 m)    Body mass index is 22.42 kg/m.  Physical Exam Vitals reviewed.  Constitutional:      Appearance: Normal appearance. She is normal weight.  HENT:     Head: Normocephalic and atraumatic.     Right Ear: Tympanic membrane, ear canal and external ear normal.     Left Ear: Tympanic membrane, ear canal and external ear normal.     Nose: Nose normal.     Mouth/Throat:     Mouth: Mucous membranes are moist.  Eyes:     Extraocular Movements: Extraocular movements intact.     Conjunctiva/sclera: Conjunctivae normal.     Pupils: Pupils are equal, round, and reactive to light.  Pulmonary:     Effort: Pulmonary effort is normal.  Abdominal:     General: Abdomen is flat.     Palpations: Abdomen is soft.  Musculoskeletal:     Cervical back: No rigidity.  Lymphadenopathy:     Head:     Right side of head: No submental, submandibular, tonsillar, preauricular, posterior auricular or occipital adenopathy.     Left side of head: Occipital (just at or under 2cm. mobile, but tender. ) adenopathy present. No submental, submandibular, tonsillar, preauricular or posterior auricular adenopathy.     Cervical: No cervical adenopathy.     Right cervical: No superficial cervical adenopathy.    Left cervical: No superficial cervical adenopathy.     Upper Body:  Right upper body: No supraclavicular or axillary adenopathy.     Left upper body: No supraclavicular or axillary adenopathy.     Lower Body: No right inguinal adenopathy. No left inguinal adenopathy.  Skin:    General: Skin is warm.     Findings: Lesion (lesions all over scalp ) present.  Neurological:     General: No focal deficit present.     Mental Status: She is alert and oriented to person, place, and time.        Assessment/plan: 1. Occipital lymphadenopathy Sebaceous cyst vs. Lymph node. About at 2cm with growing/pain will ultrasound and go from there.  - US Soft Tissue Head/Neck  (NON-THYROID); Future - CBC with Differential/Platelet  2. Other specified hypothyroidism  - TSH - T4, free  3. Encounter for screening for HIV  - HIV Antibody (routine testing w rflx)  4. Encounter for hepatitis C screening test for low risk patient  - Hepatitis C antibody   This visit occurred during the SARS-CoV-2 public health emergency.  Safety protocols were in place, including screening questions prior to the visit, additional usage of staff PPE, and extensive cleaning of exam room while observing appropriate contact time as indicated for disinfecting solutions.     Return if symptoms worsen or fail to improve.   Orma Flaming, MD [place]   08/21/2019

## 2019-08-22 LAB — HEPATITIS C ANTIBODY
Hepatitis C Ab: NONREACTIVE
SIGNAL TO CUT-OFF: 0.01 (ref <1.00)

## 2019-08-22 LAB — HIV ANTIBODY (ROUTINE TESTING W REFLEX): HIV 1&2 Ab, 4th Generation: NONREACTIVE

## 2019-09-03 ENCOUNTER — Other Ambulatory Visit: Payer: 59

## 2019-10-30 ENCOUNTER — Other Ambulatory Visit: Payer: 59

## 2019-10-30 ENCOUNTER — Other Ambulatory Visit: Payer: Self-pay

## 2019-10-30 DIAGNOSIS — Z20822 Contact with and (suspected) exposure to covid-19: Secondary | ICD-10-CM

## 2019-10-31 LAB — NOVEL CORONAVIRUS, NAA: SARS-CoV-2, NAA: NOT DETECTED

## 2020-03-03 ENCOUNTER — Encounter: Payer: Self-pay | Admitting: Family Medicine

## 2020-03-10 ENCOUNTER — Encounter: Payer: Self-pay | Admitting: Family Medicine

## 2020-03-19 ENCOUNTER — Telehealth: Payer: Self-pay

## 2020-03-19 NOTE — Telephone Encounter (Signed)
I can see tomorrow, but if red and swollen likely needs to be seen tonight.  1) make sure cat had rabies vaccine 2) she needs antibiotics. If will not go to urgent care needs to be seen in office tomorrow.  Dr. Artis Flock

## 2020-03-19 NOTE — Telephone Encounter (Signed)
Patient was scheduled for an appointment but found a place that she can get in sooner.

## 2020-03-19 NOTE — Telephone Encounter (Signed)
Nurse Assessment Nurse: Melanee Spry, RN, Beth Date/Time (Eastern Time): 03/19/2020 10:24:21 AM Confirm and document reason for call. If symptomatic, describe symptoms. ---Caller said she was just bite on the Right hand by a cat. Mom's cat. Was euthanized. Animal control transporting cat for rabies testing. Caller said she is bleeding, swollen, and has tingling pain. Caller said she is worried bc the cat hasn`t had rabies shots in a while. Caller said the cat is being transported by animal control to be tested but she wants to know if she needs testing or what should she do in the meantime. Does the patient have any new or worsening symptoms? ---Yes Will a triage be completed? ---Yes Related visit to physician within the last 2 weeks? ---No Does the PT have any chronic conditions? (i.e. diabetes, asthma, this includes High risk factors for pregnancy, etc.) ---No Is the patient pregnant or possibly pregnant? (Ask all females between the ages of 32-55) ---No Is this a behavioral health or substance abuse call? ---No Guidelines Guideline Title Affirmed Question Affirmed Notes Nurse Date/Time (Eastern Time) Animal Bite [1] Any break in skin from BITE (e.g., cut, puncture or scratch) AND Newhart, RN, Beth 03/19/2020 10:27:48 AM PLEASE NOTE: All timestamps contained within this report are represented as [place] Standard Time. CONFIDENTIALTY NOTICE: This fax transmission is intended only for the addressee. It contains information that is legally privileged, confidential or otherwise protected from use or disclosure. If you are not the intended recipient, you are strictly prohibited from reviewing, disclosing, copying using or disseminating any of this information or taking any action in reliance on or regarding this information. If you have received this fax in error, please notify us immediately by telephone so that we can arrange for its return to [place]. Phone: [phone], Toll-Free: [phone],  Fax: [phone] Page: 2 of 2 Call Id: [id number] Guidelines Guideline Title Affirmed Question Affirmed Notes Nurse Date/Time Lamount Cohen Time) [2] PET animal (e.g., dog, cat, or ferret) at risk for RABIES (e.g., sick, stray, unprovoked bite, developing country) Disp. Time Lamount Cohen Time) Disposition Final User 03/19/2020 10:22:40 AM Send to Urgent Queue Gerrie Nordmann 03/19/2020 10:28:52 AM Go to ED Now Yes Melanee Spry, RN, Beth Caller Disagree/Comply Disagree Caller Understands Yes PreDisposition InappropriateToAsk Care Advice Given Per Guideline GO TO ED NOW: CLEAN THE WOUND: * Wash all bites and scratches right away with soap and water for 5 minutes. * Then rinse the area with water for 2 to 3 minutes. * This will help decrease the chance of infection. COVER THE WOUND: * Cover the wound with a dressing. [place] SHOULD CALL ANIMAL CONTROL: * For patients referred in, the ED or PCP will call the Animal Control Center. CARE ADVICE given per Animal Bite (Adult) guideline. Comments User: June Leap, RN Date/Time Lamount Cohen Time): 03/19/2020 10:31:16 AM patient refused ED outcome, refuse to be connected with office. Referrals GO TO FACILITY REFUSE

## 2020-03-20 ENCOUNTER — Emergency Department (HOSPITAL_COMMUNITY): Payer: 59

## 2020-03-20 ENCOUNTER — Ambulatory Visit: Payer: 59 | Admitting: Family Medicine

## 2020-03-20 ENCOUNTER — Emergency Department (HOSPITAL_COMMUNITY)
Admission: EM | Admit: 2020-03-20 | Discharge: 2020-03-20 | Disposition: A | Discharge status: Home / Self Care | Payer: 59 | Attending: Emergency Medicine | Admitting: Emergency Medicine

## 2020-03-20 ENCOUNTER — Encounter (HOSPITAL_COMMUNITY): Payer: Self-pay | Admitting: Emergency Medicine

## 2020-03-20 DIAGNOSIS — S61431A Puncture wound without foreign body of right hand, initial encounter: Secondary | ICD-10-CM | POA: Diagnosis not present

## 2020-03-20 DIAGNOSIS — W5501XA Bitten by cat, initial encounter: Secondary | ICD-10-CM | POA: Diagnosis not present

## 2020-03-20 DIAGNOSIS — S6991XA Unspecified injury of right wrist, hand and finger(s), initial encounter: Secondary | ICD-10-CM | POA: Diagnosis present

## 2020-03-20 LAB — BASIC METABOLIC PANEL
Anion gap: 13 (ref 5–15)
BUN: 11 mg/dL (ref 6–20)
CO2: 22 mmol/L (ref 22–32)
Calcium: 9.2 mg/dL (ref 8.9–10.3)
Chloride: 101 mmol/L (ref 98–111)
Creatinine, Ser: 0.53 mg/dL (ref 0.44–1.00)
GFR, Estimated: >60 mL/min (ref >60)
Glucose, Bld: 94 mg/dL (ref 70–99)
Potassium: 4.1 mmol/L (ref 3.5–5.1)
Sodium: 136 mmol/L (ref 135–145)

## 2020-03-20 LAB — CBC WITH DIFFERENTIAL/PLATELET
Abs Immature Granulocytes: 0.03 10*3/uL (ref 0.00–0.07)
Basophils Absolute: 0 10*3/uL (ref 0.0–0.1)
Basophils Relative: 0 %
Eosinophils Absolute: 0.2 10*3/uL (ref 0.0–0.5)
Eosinophils Relative: 2 %
HCT: 42 % (ref 36.0–46.0)
Hemoglobin: 14.1 g/dL (ref 12.0–15.0)
Immature Granulocytes: 0 %
Lymphocytes Relative: 18 %
Lymphs Abs: 1.3 10*3/uL (ref 0.7–4.0)
MCH: 33.8 pg (ref 26.0–34.0)
MCHC: 33.6 g/dL (ref 30.0–36.0)
MCV: 100.7 fL — ABNORMAL HIGH (ref 80.0–100.0)
Monocytes Absolute: 0.6 10*3/uL (ref 0.1–1.0)
Monocytes Relative: 9 %
Neutro Abs: 5 10*3/uL (ref 1.7–7.7)
Neutrophils Relative %: 71 %
Platelets: 217 10*3/uL (ref 150–400)
RBC: 4.17 MIL/uL (ref 3.87–5.11)
RDW: 12.2 % (ref 11.5–15.5)
WBC: 7.1 10*3/uL (ref 4.0–10.5)
nRBC: 0 % (ref 0.0–0.2)

## 2020-03-20 MED ORDER — ONDANSETRON 4 MG PO TBDP
4.0000 mg | ORAL_TABLET | Freq: Once | ORAL | Status: AC
  Administered 2020-03-20: 4 mg via ORAL
  Filled 2020-03-20: qty 1

## 2020-03-20 MED ORDER — ONDANSETRON 4 MG PO TBDP: 4.0000 mg | ORAL_TABLET | Freq: Three times a day (TID) | ORAL | 0 refills | Status: DC | PRN

## 2020-03-20 MED ORDER — METRONIDAZOLE IN NACL 5-0.79 MG/ML-% IV SOLN
500.0000 mg | Freq: Once | INTRAVENOUS | Status: AC
  Administered 2020-03-20: 500 mg via INTRAVENOUS
  Filled 2020-03-20: qty 100

## 2020-03-20 MED ORDER — KETOROLAC TROMETHAMINE 60 MG/2ML IM SOLN
60.0000 mg | Freq: Once | INTRAMUSCULAR | Status: AC
  Administered 2020-03-20: 60 mg via INTRAMUSCULAR
  Filled 2020-03-20: qty 2

## 2020-03-20 MED ORDER — SODIUM CHLORIDE 0.9 % IV SOLN
2.0000 g | Freq: Once | INTRAVENOUS | Status: AC
  Administered 2020-03-20: 2 g via INTRAVENOUS
  Filled 2020-03-20: qty 20

## 2020-03-20 NOTE — Discharge Instructions (Addendum)
Plain films obtained in the ED here are reassuring without evidence of deep tissue infection or foreign body.  You do appear to have an infection subsequent to your cat bite, however you are on the appropriate outpatient antibiotic.  It has been less than 24 hours since injury and initiation of antibiotics and worsening swelling/inflammation was to be expected.  Labs are without elevated white blood cell count otherwise concerning for systemic infection.  We have treated you with IV Unasyn and Flagyl, however please continue with outpatient Augmentin.  Please take NSAIDs such as ibuprofen 600 mg every 6 hours as needed for pain symptoms.  Discontinue should you become pregnant or breast-feeding.  Please continue to make an effort to keep your hand elevated.  Ice as needed.  I have also prescribed you Zofran ODT which you can take for nausea symptoms should you develop difficulty tolerating the Augmentin.  Please return to the ED or seek immediate medical attention should you experience any systemic signs of toxicity such as fevers higher than 100.4 F.  Please also return for any significant/rapid progression of erythema (redness) or streaking up your arm.  There is no evidence of lymphangitis at this time.  Please also return to the ED for severe worsening in your pain symptoms or any other new or worsening symptoms.

## 2020-03-20 NOTE — ED Triage Notes (Signed)
Patient here for animal bite (the patient's cat) on right hand yesterday. Hand is swollen and painful.

## 2020-03-20 NOTE — ED Provider Notes (Signed)
[facility] EMERGENCY DEPARTMENT Provider Note   CSN: [id number] Arrival date & time: 03/20/20  0700     History Chief Complaint  Patient presents with   Animal Bite    Morgan [name] is a 30 y.o. female with no relevant past medical history presents the ED after sustaining animal bite to hand.  On my examination, patient reports that she was putting down her 36 year old cat yesterday when it bit her on the hand.  She states that it was technically no longer up-to-date on its rabies immunizations, but the veterinarian felt as though there was very low risk and did not believe that she required postexposure rabies prophylaxis.  Animal control was still contacted and the cat has been sent to [facility] for testing.  She was subsequently prescribed Augmentin of which she has taken two doses.  However, this morning she woke up and she had considerably more swelling involving her left hand which prompted her to engage in a televisit.  She was encouraged to come to the ED for IV antibiotics.  She denies any fevers, but endorses significant "pressure" pain involving her left hand.  No streaking, numbness, or weakness.  She states that she is unable to make a fist due to the swelling and pain.  Patient is not otherwise immunocompromised and denies pregnancy.  Patient is up-to-date on her tetanus immunization.  HPI     Past Medical History:  Diagnosis Date   Anxiety    Pott's disease     Patient Active Problem List   Diagnosis Date Noted   POTS (postural orthostatic tachycardia syndrome) 02/07/2019   Family history of early menopause 02/07/2019   Family history of ovarian cancer 02/07/2019    History reviewed. No pertinent surgical history.   OB History   No obstetric history on file.     Family History  Problem Relation Age of Onset   Hypertension Other     Social History   Tobacco Use   Smoking status: Never Smoker   Smokeless tobacco: Never  Used  Substance Use Topics   Alcohol use: No   Drug use: No    Home Medications Prior to Admission medications   Medication Sig Start Date End Date Taking? Authorizing Provider  amoxicillin-clavulanate (AUGMENTIN) 875-125 MG tablet Take 1 tablet by mouth 2 (two) times daily. 03/19/20  Yes [provider]  Dextromethorphan-guaiFENesin (ROBISEN PO) Take by mouth.   Yes [provider]  diphenhydrAMINE (BENADRYL) 25 mg capsule Take 25 mg by mouth every 6 (six) hours as needed.   Yes [provider]  hydrOXYzine (ATARAX/VISTARIL) 25 MG tablet Take 1 tablet (25 mg total) by mouth 3 (three) times daily as needed for anxiety. 07/22/19  Yes Orland Mustard, MD  ibuprofen (ADVIL) 200 MG tablet Take 200 mg by mouth every 6 (six) hours as needed for cramping or mild pain.   Yes [provider]  ondansetron (ZOFRAN ODT) 4 MG disintegrating tablet Take 1 tablet (4 mg total) by mouth every 8 (eight) hours as needed for nausea or vomiting. 03/20/20  Yes Lorelee New, PA-C  SPRINTEC 28 0.25-35 MG-MCG tablet Take 1 tablet by mouth daily. 06/11/19  Yes [provider]    Allergies    Bee venom and Cefaclor  Review of Systems   Review of Systems  All other systems reviewed and are negative.   Physical Exam Updated Vital Signs BP (!) 104/59 (BP Location: Left Arm)    Pulse 97  Temp 98.8 F (37.1 C) (Oral)    SpO2 97%   Physical Exam Vitals and nursing note reviewed. Exam conducted with a chaperone present.  Constitutional:      Appearance: Normal appearance.  HENT:     Head: Normocephalic and atraumatic.  Eyes:     General: No scleral icterus.    Conjunctiva/sclera: Conjunctivae normal.  Cardiovascular:     Rate and Rhythm: Normal rate.     Pulses: Normal pulses.  Pulmonary:     Effort: Pulmonary effort is normal.  Musculoskeletal:        General: Tenderness present.     Comments: Right hand: Puncture wounds at base of thumb over thenar  eminence.  Non draining.  No areas of fluctuance.  Diffuse swelling and inflammation, but no overlying erythema or other skin changes.  Capillary refill less than two seconds.  ROM limited due to pain and swelling.  Sensation intact throughout.    Skin:    General: Skin is dry.  Neurological:     Mental Status: She is alert.     GCS: GCS eye subscore is 4. GCS verbal subscore is 5. GCS motor subscore is 6.  Psychiatric:        Mood and Affect: Mood normal.        Behavior: Behavior normal.        Thought Content: Thought content normal.         ED Results / Procedures / Treatments   Labs (all labs ordered are listed, but only abnormal results are displayed) Labs Reviewed  CBC WITH DIFFERENTIAL/PLATELET - Abnormal; Notable for the following components:      Result Value   MCV 100.7 (*)    All other components within normal limits  BASIC METABOLIC PANEL    EKG None  Radiology DG Hand Complete Right  Result Date: 03/20/2020 CLINICAL DATA:  Cat bite at the base of the thumb. Pain and swelling. EXAM: RIGHT HAND - COMPLETE 3+ VIEW COMPARISON:  Radiographs 07/06/2009 FINDINGS: The joint spaces are maintained. No fracture or radiopaque foreign body. There is fairly marked soft tissue swelling noted between the thumb and the index finger. No obvious gas in the soft tissues. IMPRESSION: No acute bony findings or radiopaque foreign body. Electronically Signed   By: Rudie Meyer M.D.   On: 03/20/2020 08:12    Procedures Procedures (including critical care time)  Medications Ordered in ED Medications  ketorolac (TORADOL) injection 60 mg (60 mg Intramuscular Given 03/20/20 0752)  ondansetron (ZOFRAN-ODT) disintegrating tablet 4 mg (4 mg Oral Given 03/20/20 0751)  cefTRIAXone (ROCEPHIN) 2 g in sodium chloride 0.9 % 100 mL IVPB (0 g Intravenous Stopped 03/20/20 1119)  metroNIDAZOLE (FLAGYL) IVPB 500 mg (0 mg Intravenous Stopped 03/20/20 1039)    ED Course  I have reviewed the triage  vital signs and the nursing notes.  Pertinent labs & imaging results that were available during my care of the patient were reviewed by me and considered in my medical decision making (see chart for details).    MDM Rules/Calculators/A&P                          Morgan [name] was evaluated in Emergency Department on 03/20/2020 for the symptoms described in the history of present illness. She was evaluated in the context of the global COVID-19 pandemic, which necessitated consideration that the patient might be at risk for infection with the SARS-CoV-2 virus that causes COVID-19.  Institutional protocols and algorithms that pertain to the evaluation of patients at risk for COVID-19 are in a state of rapid change based on information released by regulatory bodies including the CDC and federal and state organizations. These policies and algorithms were followed during the patient's care in the ED.  I personally reviewed patient's medical chart and all notes from triage and staff during today's encounter. I have also ordered and reviewed all labs and imaging that I felt to be medically necessary in the evaluation of this patient's complaints and with consideration of their with their physical exam. If needed, translation services were available and utilized.   Patient presented to the ED for cat bite.  She is less than 24 hours since sustaining her injury and has only received two doses of Augmentin.  She was prompted to come to the ED for evaluation by a telehealth consultant for IV antibiotics despite brevity of her illness.  She is not toxic appearing and she is afebrile here in the ED.  She does have swelling and inflammation involving right hand that is limiting her range of motion, however there is no streaking concerning for lymphangitis.  No areas of fluctuance concerning for abscess.  Plain films are obtained and personally reviewed which demonstrate no evidence of osteomyelitis or foreign body.   Patient is up-to-date on tetanus immunization.    Patient was treated with Toradol here in the ED.  She was also given Zofran ODT given nausea symptoms.  Encouraging her to keep her hand elevated to help with inflammation.  I suspect that her infection is from Pasteurella species and advised her to continue with her Augmentin as it is sensitive.  Do not feel as though antibiotic change is warranted.  She states that she had taken doxycycline in the past which made her particularly nauseated.  Given brevity of antibiotic use, do not feel as though her clinical worsening warrants IV antibiotics, especially given reassuring physical exam.  No fusiform swelling of digits otherwise concerning for tenosynovitis.  No overlying erythema or significant warmth.  See pictures.  CBC without leukocytosis.  Patient treated with 2 g IV Rocephin and 500 mg IV Flagyl here in the ED.  After which she will be stable for discharge home with continued outpatient Augmentin.  We will also prescribe her Zofran ODT for her to take as needed for nausea symptoms given reported nausea with antibiotic use.  Encouraged her to continue ibuprofen 600 mg every 6 hours as needed for inflammation and pain control.  I am also encouraging her to keep her right hand elevated to help mitigate swelling and discomfort.  Ice as needed.  ED return precautions discussed.  Patient and husband at bedside express understanding and are agreeable.    Final Clinical Impression(s) / ED Diagnoses Final diagnoses:  Cat bite, initial encounter    Rx / DC Orders ED Discharge Orders         Ordered    ondansetron (ZOFRAN ODT) 4 MG disintegrating tablet  Every 8 hours PRN        03/20/20 0843           Lorelee New, PA-C 03/20/20 1125    Arby Barrette, MD 03/20/20 1534

## 2020-03-20 NOTE — ED Provider Notes (Signed)
Medical screening examination/treatment/procedure(s) were conducted as a shared visit with non-physician practitioner(s) and myself.  I personally evaluated the patient during the encounter.    She was bitten yesterday by her cat.  She got started on Augmentin yesterday but this morning had increased pain and swelling.  She reports she has pain if she moves her hand or wrist.  No fevers or chills  Patient is alert and nontoxic.  No respiratory distress.  Right Hand is moderately swollen.  Patient and range of motion of digits is painful.  No swelling of the wrist or forearm.  No lymphadenopathy or streaking.  Patient swelling.  Not significant erythema at this time.  Significant pain.  Due to risk of rapid advancement of infection with cat bites will administer IV antibiotics 2 g Rocephin and Flagyl in the emergency department.  Patient will be continued on her Augmentin.  Careful return precautions reviewed.   Arby Barrette, MD 03/20/20 [phone]

## 2021-04-27 LAB — OB RESULTS CONSOLE RUBELLA ANTIBODY, IGM
Rubella: IMMUNE
Rubella: IMMUNE

## 2021-04-27 LAB — OB RESULTS CONSOLE RPR: RPR: NONREACTIVE

## 2021-04-27 LAB — OB RESULTS CONSOLE HEPATITIS B SURFACE ANTIGEN: Hepatitis B Surface Ag: NEGATIVE

## 2021-05-24 LAB — OB RESULTS CONSOLE GC/CHLAMYDIA
Chlamydia: NEGATIVE
Neisseria Gonorrhea: NEGATIVE

## 2021-09-01 LAB — OB RESULTS CONSOLE HIV ANTIBODY (ROUTINE TESTING): HIV: NONREACTIVE

## 2021-11-04 LAB — OB RESULTS CONSOLE GBS: GBS: POSITIVE

## 2021-11-15 ENCOUNTER — Encounter (HOSPITAL_COMMUNITY): Payer: Self-pay | Admitting: Obstetrics and Gynecology

## 2021-11-15 ENCOUNTER — Inpatient Hospital Stay (HOSPITAL_COMMUNITY)
Admission: AD | Admit: 2021-11-15 | Discharge: 2021-11-19 | DRG: 787 | Disposition: A | Discharge status: Home / Self Care | Payer: BC Managed Care – PPO | Attending: Obstetrics & Gynecology | Admitting: Obstetrics & Gynecology

## 2021-11-15 ENCOUNTER — Other Ambulatory Visit: Payer: Self-pay

## 2021-11-15 DIAGNOSIS — O134 Gestational [pregnancy-induced] hypertension without significant proteinuria, complicating childbirth: Secondary | ICD-10-CM | POA: Diagnosis present

## 2021-11-15 DIAGNOSIS — O9081 Anemia of the puerperium: Secondary | ICD-10-CM | POA: Diagnosis not present

## 2021-11-15 DIAGNOSIS — O1414 Severe pre-eclampsia complicating childbirth: Principal | ICD-10-CM | POA: Diagnosis present

## 2021-11-15 DIAGNOSIS — O99824 Streptococcus B carrier state complicating childbirth: Secondary | ICD-10-CM | POA: Diagnosis present

## 2021-11-15 DIAGNOSIS — Z3A38 38 weeks gestation of pregnancy: Secondary | ICD-10-CM

## 2021-11-15 DIAGNOSIS — D62 Acute posthemorrhagic anemia: Secondary | ICD-10-CM | POA: Diagnosis not present

## 2021-11-15 DIAGNOSIS — Z3490 Encounter for supervision of normal pregnancy, unspecified, unspecified trimester: Principal | ICD-10-CM | POA: Diagnosis present

## 2021-11-15 HISTORY — DX: Postural orthostatic tachycardia syndrome (POTS): G90.A

## 2021-11-15 LAB — CBC
HCT: 36.4 % (ref 36.0–46.0)
Hemoglobin: 12.1 g/dL (ref 12.0–15.0)
MCH: 31.1 pg (ref 26.0–34.0)
MCHC: 33.2 g/dL (ref 30.0–36.0)
MCV: 93.6 fL (ref 80.0–100.0)
Platelets: 167 10*3/uL (ref 150–400)
RBC: 3.89 MIL/uL (ref 3.87–5.11)
RDW: 12.2 % (ref 11.5–15.5)
WBC: 9.2 10*3/uL (ref 4.0–10.5)
nRBC: 0 % (ref 0.0–0.2)

## 2021-11-15 LAB — COMPREHENSIVE METABOLIC PANEL
ALT: 15 U/L (ref 0–44)
AST: 21 U/L (ref 15–41)
Albumin: 3 g/dL — ABNORMAL LOW (ref 3.5–5.0)
Alkaline Phosphatase: 85 U/L (ref 38–126)
Anion gap: 10 (ref 5–15)
BUN: 10 mg/dL (ref 6–20)
CO2: 20 mmol/L — ABNORMAL LOW (ref 22–32)
Calcium: 9 mg/dL (ref 8.9–10.3)
Chloride: 107 mmol/L (ref 98–111)
Creatinine, Ser: 0.78 mg/dL (ref 0.44–1.00)
GFR, Estimated: >60 mL/min (ref >60)
Glucose, Bld: 94 mg/dL (ref 70–99)
Potassium: 3.7 mmol/L (ref 3.5–5.1)
Sodium: 137 mmol/L (ref 135–145)
Total Bilirubin: 0.6 mg/dL (ref 0.3–1.2)
Total Protein: 6.2 g/dL — ABNORMAL LOW (ref 6.5–8.1)

## 2021-11-15 LAB — TYPE AND SCREEN
ABO/RH(D): A POS
Antibody Screen: NEGATIVE

## 2021-11-15 MED ORDER — TERBUTALINE SULFATE 1 MG/ML IJ SOLN
0.2500 mg | Freq: Once | INTRAMUSCULAR | Status: AC | PRN
  Administered 2021-11-16: 0.25 mg via SUBCUTANEOUS
  Filled 2021-11-15: qty 1

## 2021-11-15 MED ORDER — VANCOMYCIN HCL 10 G IV SOLR: 20.0000 mg/kg | Freq: Three times a day (TID) | INTRAVENOUS | Status: DC

## 2021-11-15 MED ORDER — SODIUM CHLORIDE 0.9 % IV SOLN
5.0000 10*6.[IU] | Freq: Once | INTRAVENOUS | Status: AC
  Administered 2021-11-15: 5 10*6.[IU] via INTRAVENOUS
  Filled 2021-11-15: qty 5

## 2021-11-15 MED ORDER — ONDANSETRON HCL 4 MG/2ML IJ SOLN
4.0000 mg | Freq: Four times a day (QID) | INTRAMUSCULAR | Status: DC | PRN
  Filled 2021-11-15: qty 2

## 2021-11-15 MED ORDER — FENTANYL CITRATE (PF) 100 MCG/2ML IJ SOLN
50.0000 ug | INTRAMUSCULAR | Status: DC | PRN
Start: 2021-11-15 — End: 2021-11-17
  Administered 2021-11-16: 50 ug via INTRAVENOUS
  Administered 2021-11-16: 100 ug via INTRAVENOUS
  Filled 2021-11-15 (×3): qty 2

## 2021-11-15 MED ORDER — PENICILLIN G POT IN DEXTROSE 60000 UNIT/ML IV SOLN
3.0000 10*6.[IU] | INTRAVENOUS | Status: DC
  Administered 2021-11-16 – 2021-11-17 (×8): 3 10*6.[IU] via INTRAVENOUS
  Filled 2021-11-15 (×8): qty 50

## 2021-11-15 MED ORDER — OXYTOCIN-SODIUM CHLORIDE 30-0.9 UT/500ML-% IV SOLN: 2.5000 [IU]/h | INTRAVENOUS | Status: DC

## 2021-11-15 MED ORDER — OXYTOCIN BOLUS FROM INFUSION
333.0000 mL | Freq: Once | INTRAVENOUS | Status: DC
Start: 2021-11-15 — End: 2021-11-17

## 2021-11-15 MED ORDER — ZOLPIDEM TARTRATE 5 MG PO TABS
5.0000 mg | ORAL_TABLET | Freq: Every evening | ORAL | Status: DC | PRN
  Administered 2021-11-15: 5 mg via ORAL
  Filled 2021-11-15: qty 1

## 2021-11-15 MED ORDER — MISOPROSTOL 25 MCG QUARTER TABLET
25.0000 ug | ORAL_TABLET | ORAL | Status: DC | PRN
  Administered 2021-11-15 – 2021-11-16 (×3): 25 ug via VAGINAL
  Filled 2021-11-15 (×3): qty 1

## 2021-11-15 MED ORDER — ACETAMINOPHEN 325 MG PO TABS
650.0000 mg | ORAL_TABLET | ORAL | Status: DC | PRN
  Administered 2021-11-15: 650 mg via ORAL
  Filled 2021-11-15: qty 2

## 2021-11-15 MED ORDER — SOD CITRATE-CITRIC ACID 500-334 MG/5ML PO SOLN
30.0000 mL | ORAL | Status: DC | PRN
  Administered 2021-11-16 – 2021-11-17 (×2): 30 mL via ORAL
  Filled 2021-11-15 (×2): qty 30

## 2021-11-15 MED ORDER — LACTATED RINGERS IV SOLN
500.0000 mL | INTRAVENOUS | Status: DC | PRN
Start: 2021-11-15 — End: 2021-11-17
  Administered 2021-11-16: 500 mL via INTRAVENOUS

## 2021-11-15 MED ORDER — LACTATED RINGERS IV SOLN: INTRAVENOUS | Status: DC

## 2021-11-15 MED ORDER — LIDOCAINE HCL (PF) 1 % IJ SOLN: 30.0000 mL | INTRAMUSCULAR | Status: DC | PRN

## 2021-11-15 NOTE — H&P (Addendum)
Morgan [name] is a 31 y.o. female G1 at [redacted]w[redacted]d presenting for IOL secondary to [facility].  Patient denies HA, vision change, RUQ pain, CP/SOB.  Pre-E labs are wnl.  Antepartum course complicated by anxiety; stable on no medication.  Patient is GBS positive.    OB History     Gravida  1   Para      Term      Preterm      AB      Living         SAB      IAB      Ectopic      Multiple      Live Births             Past Medical History:  Diagnosis Date   Anxiety    POTS (postural orthostatic tachycardia syndrome)    Past Surgical History:  Procedure Laterality Date   WISDOM TOOTH EXTRACTION Bilateral    WRIST SURGERY Left    Family History: family history includes Hypertension in an other family member. Social History:  reports that she has never smoked. She has never used smokeless tobacco. She reports current alcohol use. She reports that she does not use drugs.     Maternal Diabetes: No Genetic Screening: Normal Maternal Ultrasounds/Referrals: Normal Fetal Ultrasounds or other Referrals:  None Maternal Substance Abuse:  No Significant Maternal Medications:  None Significant Maternal Lab Results:  Group B Strep positive Number of Prenatal Visits:greater than 3 verified prenatal visits Other Comments:  None  Review of Systems Maternal Medical History:  Contractions: Frequency: rare.   Perceived severity is mild.   Fetal activity: Perceived fetal activity is normal.   Last perceived fetal movement was within the past hour.   Prenatal complications: no prenatal complications Prenatal Complications - Diabetes: none.   Dilation: 1 Effacement (%): 50 Station: Ballotable Exam by:: Dr. Lynnette Caffey Blood pressure 125/80, pulse 94, temperature 98.1 F (36.7 C), temperature source Oral, resp. rate 16, height 4\' 11"  (1.499 m), weight 71.4 kg. Maternal Exam:  Uterine Assessment: Contraction strength is mild.  Contraction frequency is rare.  Abdomen: Patient reports  no abdominal tenderness. Fundal height is c/w dates.   Estimated fetal weight is 7#.     Fetal Exam Fetal Monitor Review: Baseline rate: 140.  Variability: moderate (6-25 bpm).   Pattern: accelerations present and no decelerations.   Fetal State Assessment: Category I - tracings are normal.   Physical Exam Constitutional:      Appearance: Normal appearance.  HENT:     Head: Normocephalic and atraumatic.  Pulmonary:     Effort: Pulmonary effort is normal.  Abdominal:     Palpations: Abdomen is soft.  Musculoskeletal:        General: Normal range of motion.     Cervical back: Normal range of motion.  Skin:    General: Skin is warm and dry.  Neurological:     Mental Status: She is alert and oriented to person, place, and time.  Psychiatric:        Mood and Affect: Mood normal.        Behavior: Behavior normal.     Prenatal labs: ABO, Rh: --/--/A POS (09/18 1722) Antibody: NEG (09/18 1722) Rubella: Immune, Immune (02/28 0000) RPR: Nonreactive (02/28 0000)  HBsAg: Negative (02/28 0000)  HIV: Non-reactive (07/05 0000)  GBS: Positive/-- (09/07 0000)   Assessment/Plan: 31yo G1 at [redacted]w[redacted]d with GHTN -Normal to mild range BPs, no symptoms and normal labs.  Will continue to monitor -VMP IOL -CLEA if desired -Anticipate NSVD -PCN for GBS ppx   Mitchel Honour 11/15/2021, 7:17 PM

## 2021-11-16 ENCOUNTER — Inpatient Hospital Stay (HOSPITAL_COMMUNITY): Payer: BC Managed Care – PPO | Admitting: Anesthesiology

## 2021-11-16 LAB — CBC
HCT: 36.2 % (ref 36.0–46.0)
Hemoglobin: 12.4 g/dL (ref 12.0–15.0)
MCH: 31.6 pg (ref 26.0–34.0)
MCHC: 34.3 g/dL (ref 30.0–36.0)
MCV: 92.3 fL (ref 80.0–100.0)
Platelets: 163 10*3/uL (ref 150–400)
RBC: 3.92 MIL/uL (ref 3.87–5.11)
RDW: 12.2 % (ref 11.5–15.5)
WBC: 14.8 10*3/uL — ABNORMAL HIGH (ref 4.0–10.5)
nRBC: 0 % (ref 0.0–0.2)

## 2021-11-16 LAB — RPR: RPR Ser Ql: NONREACTIVE

## 2021-11-16 MED ORDER — LACTATED RINGERS IV SOLN: 500.0000 mL | Freq: Once | INTRAVENOUS | Status: DC

## 2021-11-16 MED ORDER — PHENYLEPHRINE 80 MCG/ML (10ML) SYRINGE FOR IV PUSH (FOR BLOOD PRESSURE SUPPORT)
80.0000 ug | PREFILLED_SYRINGE | INTRAVENOUS | Status: DC | PRN
  Administered 2021-11-16: 80 ug via INTRAVENOUS

## 2021-11-16 MED ORDER — FENTANYL-BUPIVACAINE-NACL 0.5-0.125-0.9 MG/250ML-% EP SOLN
12.0000 mL/h | EPIDURAL | Status: DC | PRN
  Administered 2021-11-16 – 2021-11-17 (×3): 12 mL/h via EPIDURAL
  Filled 2021-11-16 (×3): qty 250

## 2021-11-16 MED ORDER — PHENYLEPHRINE 80 MCG/ML (10ML) SYRINGE FOR IV PUSH (FOR BLOOD PRESSURE SUPPORT)
80.0000 ug | PREFILLED_SYRINGE | INTRAVENOUS | Status: DC | PRN
  Administered 2021-11-16: 240 ug via INTRAVENOUS
  Filled 2021-11-16: qty 10

## 2021-11-16 MED ORDER — LIDOCAINE-EPINEPHRINE (PF) 2 %-1:200000 IJ SOLN
INTRAMUSCULAR | Status: DC | PRN
  Administered 2021-11-16: 5 mL via EPIDURAL
  Administered 2021-11-17: 10 mL via EPIDURAL
  Administered 2021-11-17: 5 mL via EPIDURAL

## 2021-11-16 MED ORDER — EPHEDRINE 5 MG/ML INJ: 10.0000 mg | INTRAVENOUS | Status: DC | PRN

## 2021-11-16 MED ORDER — OXYTOCIN-SODIUM CHLORIDE 30-0.9 UT/500ML-% IV SOLN
1.0000 m[IU]/min | INTRAVENOUS | Status: DC
  Administered 2021-11-16: 2 m[IU]/min via INTRAVENOUS
  Filled 2021-11-16: qty 500

## 2021-11-16 MED ORDER — BUPIVACAINE HCL (PF) 0.25 % IJ SOLN
INTRAMUSCULAR | Status: DC | PRN
  Administered 2021-11-16: 8 mL via EPIDURAL
  Administered 2021-11-17: 10 mL via EPIDURAL

## 2021-11-16 MED ORDER — TERBUTALINE SULFATE 1 MG/ML IJ SOLN: 0.2500 mg | Freq: Once | INTRAMUSCULAR | Status: DC | PRN

## 2021-11-16 MED ORDER — DIPHENHYDRAMINE HCL 50 MG/ML IJ SOLN: 12.5000 mg | INTRAMUSCULAR | Status: DC | PRN

## 2021-11-16 MED ORDER — FENTANYL CITRATE (PF) 100 MCG/2ML IJ SOLN
INTRAMUSCULAR | Status: DC | PRN
  Administered 2021-11-16: 100 ug via EPIDURAL

## 2021-11-16 NOTE — Anesthesia Preprocedure Evaluation (Signed)
Anesthesia Evaluation  Patient identified by MRN, date of birth, ID band Patient awake    Reviewed: Allergy & Precautions, NPO status , Patient's Chart, lab work & pertinent test results  Airway Mallampati: II  TM Distance: >3 FB Neck ROM: Full    Dental no notable dental hx.    Pulmonary neg pulmonary ROS,    Pulmonary exam normal breath sounds clear to auscultation       Cardiovascular hypertension, Normal cardiovascular exam Rhythm:Regular Rate:Normal     Neuro/Psych PSYCHIATRIC DISORDERS Anxiety negative neurological ROS     GI/Hepatic negative GI ROS, Neg liver ROS,   Endo/Other  negative endocrine ROS  Renal/GU negative Renal ROS  negative genitourinary   Musculoskeletal negative musculoskeletal ROS (+)   Abdominal   Peds  Hematology negative hematology ROS (+)   Anesthesia Other Findings IOL for gHTN  Reproductive/Obstetrics (+) Pregnancy                             Anesthesia Physical Anesthesia Plan  ASA: 3  Anesthesia Plan: Epidural   Post-op Pain Management:    Induction:   PONV Risk Score and Plan: Treatment may vary due to age or medical condition  Airway Management Planned: Natural Airway  Additional Equipment:   Intra-op Plan:   Post-operative Plan:   Informed Consent: I have reviewed the patients History and Physical, chart, labs and discussed the procedure including the risks, benefits and alternatives for the proposed anesthesia with the patient or authorized representative who has indicated his/her understanding and acceptance.       Plan Discussed with: Anesthesiologist  Anesthesia Plan Comments: (Patient identified. Risks, benefits, options discussed with patient including but not limited to bleeding, infection, nerve damage, paralysis, failed block, incomplete pain control, headache, blood pressure changes, nausea, vomiting, reactions to medication,  itching, and post partum back pain. Confirmed with bedside nurse the patient's most recent platelet count. Confirmed with the patient that they are not taking any anticoagulation, have any bleeding history or any family history of bleeding disorders. Patient expressed understanding and wishes to proceed. All questions were answered. )        Anesthesia Quick Evaluation

## 2021-11-16 NOTE — Anesthesia Procedure Notes (Signed)
Epidural Patient location during procedure: OB Start time: 11/16/2021 5:40 AM End time: 11/16/2021 5:50 AM  Staffing Anesthesiologist: Freddrick March, MD Performed: anesthesiologist   Preanesthetic Checklist Completed: patient identified, IV checked, risks and benefits discussed, monitors and equipment checked, pre-op evaluation and timeout performed  Epidural Patient position: sitting Prep: DuraPrep and site prepped and draped Patient monitoring: continuous pulse ox, blood pressure, heart rate and cardiac monitor Approach: midline Location: L3-L4 Injection technique: LOR air  Needle:  Needle type: Tuohy  Needle gauge: 17 G Needle length: 9 cm Needle insertion depth: 4 cm Catheter type: closed end flexible Catheter size: 19 Gauge Catheter at skin depth: 10 cm Test dose: negative  Assessment Sensory level: T8 Events: blood not aspirated, injection not painful, no injection resistance, no paresthesia and negative IV test  Additional Notes Patient identified. Risks/Benefits/Options discussed with patient including but not limited to bleeding, infection, nerve damage, paralysis, failed block, incomplete pain control, headache, blood pressure changes, nausea, vomiting, reactions to medication both or allergic, itching and postpartum back pain. Confirmed with bedside nurse the patient's most recent platelet count. Confirmed with patient that they are not currently taking any anticoagulation, have any bleeding history or any family history of bleeding disorders. Patient expressed understanding and wished to proceed. All questions were answered. Sterile technique was used throughout the entire procedure. Please see nursing notes for vital signs. Test dose was given through epidural catheter and negative prior to continuing to dose epidural or start infusion. Warning signs of high block given to the patient including shortness of breath, tingling/numbness in hands, complete motor block,  or any concerning symptoms with instructions to call for help. Patient was given instructions on fall risk and not to get out of bed. All questions and concerns addressed with instructions to call with any issues or inadequate analgesia.  Reason for block:procedure for pain

## 2021-11-16 NOTE — Progress Notes (Signed)
Morgan [name] is a 31 y.o. G1P0 at [redacted]w[redacted]d by LMP admitted for induction of labor due to gestational htn without severe feature.  Subjective:   Objective: BP 105/65   Pulse (!) 110   Temp 98.1 F (36.7 C) (Oral)   Resp 15   Ht 4\' 11"  (1.499 m)   Wt 71.4 kg   SpO2 99%   BMI 31.77 kg/m  No intake/output data recorded. No intake/output data recorded.  FHT:  FHR: 140 bpm, variability: moderate,  accelerations:  Present,  decelerations:  Absent UC:   regular, every 3 minutes SVE:   Dilation: 3 Effacement (%): 90 Station: -2 Exam by:: [employer] RN  Labs: Lab Results  Component Value Date   WBC 14.8 (H) 11/16/2021   HGB 12.4 11/16/2021   HCT 36.2 11/16/2021   MCV 92.3 11/16/2021   PLT 163 11/16/2021    Assessment / Plan: Labor:  progressing after srom and VMP Preeclampsia:  no signs or symptoms of toxicity and labs stable Fetal Wellbeing:  Category I Pain Control:  Epidural I/D:  n/a abx gor GBS Anticipated MOD:  NSVD  Luz Lex, MD 11/16/2021, 7:57 AM

## 2021-11-16 NOTE — Progress Notes (Signed)
Morgan [name] is a 31 y.o. G1P0 at [redacted]w[redacted]d by ultrasound admitted for induction of labor due to [facility].  Subjective: Feeling intense CTX since SROM at 0355; awaiting PLT for CLEA placement.    Objective: BP (!) 128/93   Pulse 81   Temp 98.1 F (36.7 C) (Oral)   Resp 15   Ht 4\' 11"  (1.499 m)   Wt 71.4 kg   BMI 31.77 kg/m  No intake/output data recorded. No intake/output data recorded.  FHT:  FHR: 130 bpm, variability: moderate,  accelerations:  Present,  decelerations:  Absent UC:   regular, every 2 minutes SVE:   Dilation: 1.5 Effacement (%): 50 Station: -3 Exam by:: [employer] RN  Labs: Lab Results  Component Value Date   WBC 14.8 (H) 11/16/2021   HGB 12.4 11/16/2021   HCT 36.2 11/16/2021   MCV 92.3 11/16/2021   PLT 163 11/16/2021    Assessment / Plan: Induction of labor for GHTN  Labor:  s/p VMP x 3; last dose at 0310 .  Now with SROM. Preeclampsia:   n/a Fetal Wellbeing:  Category I Pain Control:   Planning CLEA once labs are back.  There was a delay in lab coming to draw and patient was given fentanyl and terbutaline. I/D:  n/a Anticipated MOD:  NSVD  Linda Hedges, DO 11/16/2021, 5:36 AM

## 2021-11-17 ENCOUNTER — Other Ambulatory Visit: Payer: Self-pay

## 2021-11-17 ENCOUNTER — Encounter (HOSPITAL_COMMUNITY): Payer: Self-pay | Admitting: Obstetrics and Gynecology

## 2021-11-17 ENCOUNTER — Encounter (HOSPITAL_COMMUNITY)
Admission: AD | Disposition: A | Discharge status: Home / Self Care | Payer: Self-pay | Source: Home / Self Care | Attending: Obstetrics & Gynecology

## 2021-11-17 LAB — CBC
HCT: 28.1 % — ABNORMAL LOW (ref 36.0–46.0)
Hemoglobin: 9.7 g/dL — ABNORMAL LOW (ref 12.0–15.0)
MCH: 31.8 pg (ref 26.0–34.0)
MCHC: 34.5 g/dL (ref 30.0–36.0)
MCV: 92.1 fL (ref 80.0–100.0)
Platelets: 114 10*3/uL — ABNORMAL LOW (ref 150–400)
RBC: 3.05 MIL/uL — ABNORMAL LOW (ref 3.87–5.11)
RDW: 12.4 % (ref 11.5–15.5)
WBC: 17.8 10*3/uL — ABNORMAL HIGH (ref 4.0–10.5)
nRBC: 0 % (ref 0.0–0.2)

## 2021-11-17 LAB — COMPREHENSIVE METABOLIC PANEL
ALT: 13 U/L (ref 0–44)
AST: 30 U/L (ref 15–41)
Albumin: 1.9 g/dL — ABNORMAL LOW (ref 3.5–5.0)
Alkaline Phosphatase: 67 U/L (ref 38–126)
Anion gap: 8 (ref 5–15)
BUN: 11 mg/dL (ref 6–20)
CO2: 21 mmol/L — ABNORMAL LOW (ref 22–32)
Calcium: 7.9 mg/dL — ABNORMAL LOW (ref 8.9–10.3)
Chloride: 107 mmol/L (ref 98–111)
Creatinine, Ser: 1.59 mg/dL — ABNORMAL HIGH (ref 0.44–1.00)
GFR, Estimated: 44 mL/min — ABNORMAL LOW (ref >60)
Glucose, Bld: 90 mg/dL (ref 70–99)
Potassium: 4.1 mmol/L (ref 3.5–5.1)
Sodium: 136 mmol/L (ref 135–145)
Total Bilirubin: 0.6 mg/dL (ref 0.3–1.2)
Total Protein: 4.5 g/dL — ABNORMAL LOW (ref 6.5–8.1)

## 2021-11-17 LAB — MAGNESIUM: Magnesium: 4.7 mg/dL — ABNORMAL HIGH (ref 1.7–2.4)

## 2021-11-17 SURGERY — Surgical Case
Anesthesia: Epidural

## 2021-11-17 MED ORDER — COCONUT OIL OIL: 1.0000 | TOPICAL_OIL | Status: DC | PRN

## 2021-11-17 MED ORDER — SENNOSIDES-DOCUSATE SODIUM 8.6-50 MG PO TABS
2.0000 | ORAL_TABLET | ORAL | Status: DC
  Administered 2021-11-18 – 2021-11-19 (×2): 2 via ORAL
  Filled 2021-11-17 (×2): qty 2

## 2021-11-17 MED ORDER — SODIUM CHLORIDE 0.9 % IV SOLN
2.0000 g | Freq: Two times a day (BID) | INTRAVENOUS | Status: DC
  Administered 2021-11-17: 2 g via INTRAVENOUS
  Filled 2021-11-17 (×2): qty 2

## 2021-11-17 MED ORDER — MENTHOL 3 MG MT LOZG: 1.0000 | LOZENGE | OROMUCOSAL | Status: DC | PRN

## 2021-11-17 MED ORDER — METHYLERGONOVINE MALEATE 0.2 MG/ML IJ SOLN
INTRAMUSCULAR | Status: AC
  Filled 2021-11-17: qty 1

## 2021-11-17 MED ORDER — FENTANYL CITRATE (PF) 100 MCG/2ML IJ SOLN
25.0000 ug | INTRAMUSCULAR | Status: DC | PRN
  Administered 2021-11-17 (×2): 50 ug via INTRAVENOUS

## 2021-11-17 MED ORDER — ZOLPIDEM TARTRATE 5 MG PO TABS: 5.0000 mg | ORAL_TABLET | Freq: Every evening | ORAL | Status: DC | PRN

## 2021-11-17 MED ORDER — DIBUCAINE (PERIANAL) 1 % EX OINT: 1.0000 | TOPICAL_OINTMENT | CUTANEOUS | Status: DC | PRN

## 2021-11-17 MED ORDER — OXYTOCIN-SODIUM CHLORIDE 30-0.9 UT/500ML-% IV SOLN
INTRAVENOUS | Status: AC
  Filled 2021-11-17: qty 500

## 2021-11-17 MED ORDER — ONDANSETRON HCL 4 MG/2ML IJ SOLN
INTRAMUSCULAR | Status: DC | PRN
  Administered 2021-11-17: 4 mg via INTRAVENOUS

## 2021-11-17 MED ORDER — FENTANYL CITRATE (PF) 100 MCG/2ML IJ SOLN
INTRAMUSCULAR | Status: AC
  Filled 2021-11-17: qty 2

## 2021-11-17 MED ORDER — TRANEXAMIC ACID-NACL 1000-0.7 MG/100ML-% IV SOLN
INTRAVENOUS | Status: AC
  Filled 2021-11-17: qty 100

## 2021-11-17 MED ORDER — LACTATED RINGERS IV SOLN: INTRAVENOUS | Status: DC

## 2021-11-17 MED ORDER — ACETAMINOPHEN 10 MG/ML IV SOLN
INTRAVENOUS | Status: AC
  Filled 2021-11-17: qty 100

## 2021-11-17 MED ORDER — PHENYLEPHRINE 80 MCG/ML (10ML) SYRINGE FOR IV PUSH (FOR BLOOD PRESSURE SUPPORT)
PREFILLED_SYRINGE | INTRAVENOUS | Status: AC
  Filled 2021-11-17: qty 10

## 2021-11-17 MED ORDER — SODIUM CHLORIDE 0.9 % IV SOLN
INTRAVENOUS | Status: AC
  Filled 2021-11-17: qty 2

## 2021-11-17 MED ORDER — OXYTOCIN-SODIUM CHLORIDE 30-0.9 UT/500ML-% IV SOLN: 2.5000 [IU]/h | INTRAVENOUS | Status: AC

## 2021-11-17 MED ORDER — LABETALOL HCL 5 MG/ML IV SOLN
20.0000 mg | Freq: Once | INTRAVENOUS | Status: AC
  Administered 2021-11-17: 20 mg via INTRAVENOUS
  Filled 2021-11-17: qty 4

## 2021-11-17 MED ORDER — MORPHINE SULFATE (PF) 0.5 MG/ML IJ SOLN
INTRAMUSCULAR | Status: AC
  Filled 2021-11-17: qty 10

## 2021-11-17 MED ORDER — WITCH HAZEL-GLYCERIN EX PADS: 1.0000 | MEDICATED_PAD | CUTANEOUS | Status: DC | PRN

## 2021-11-17 MED ORDER — MAGNESIUM SULFATE 40 GM/1000ML IV SOLN
1.0000 g/h | INTRAVENOUS | Status: DC
  Administered 2021-11-17: 2 g/h via INTRAVENOUS
  Filled 2021-11-17: qty 1000

## 2021-11-17 MED ORDER — LABETALOL HCL 200 MG PO TABS
200.0000 mg | ORAL_TABLET | Freq: Three times a day (TID) | ORAL | Status: DC
  Administered 2021-11-17 – 2021-11-19 (×8): 200 mg via ORAL
  Filled 2021-11-17 (×8): qty 1

## 2021-11-17 MED ORDER — STERILE WATER FOR IRRIGATION IR SOLN
Status: DC | PRN
  Administered 2021-11-17: 1000 mL

## 2021-11-17 MED ORDER — ONDANSETRON HCL 4 MG/2ML IJ SOLN
INTRAMUSCULAR | Status: AC
  Filled 2021-11-17: qty 2

## 2021-11-17 MED ORDER — LACTATED RINGERS IV SOLN: INTRAVENOUS | Status: DC | PRN

## 2021-11-17 MED ORDER — LIDOCAINE-EPINEPHRINE (PF) 2 %-1:200000 IJ SOLN
INTRAMUSCULAR | Status: AC
  Filled 2021-11-17: qty 20

## 2021-11-17 MED ORDER — METHYLERGONOVINE MALEATE 0.2 MG/ML IJ SOLN
INTRAMUSCULAR | Status: DC | PRN
  Administered 2021-11-17: .2 mg via INTRAMUSCULAR

## 2021-11-17 MED ORDER — MEASLES, MUMPS & RUBELLA VAC IJ SOLR: 0.5000 mL | Freq: Once | INTRAMUSCULAR | Status: DC

## 2021-11-17 MED ORDER — ACETAMINOPHEN 325 MG PO TABS: 650.0000 mg | ORAL_TABLET | ORAL | Status: DC | PRN

## 2021-11-17 MED ORDER — DEXAMETHASONE SODIUM PHOSPHATE 10 MG/ML IJ SOLN
INTRAMUSCULAR | Status: DC | PRN
  Administered 2021-11-17: 5 mg via INTRAVENOUS

## 2021-11-17 MED ORDER — TRANEXAMIC ACID-NACL 1000-0.7 MG/100ML-% IV SOLN
INTRAVENOUS | Status: DC | PRN
  Administered 2021-11-17: 1000 mg via INTRAVENOUS

## 2021-11-17 MED ORDER — LABETALOL HCL 5 MG/ML IV SOLN: 20.0000 mg | INTRAVENOUS | Status: DC | PRN

## 2021-11-17 MED ORDER — HYDRALAZINE HCL 20 MG/ML IJ SOLN: 10.0000 mg | INTRAMUSCULAR | Status: DC | PRN

## 2021-11-17 MED ORDER — DIPHENHYDRAMINE HCL 25 MG PO CAPS: 25.0000 mg | ORAL_CAPSULE | Freq: Four times a day (QID) | ORAL | Status: DC | PRN

## 2021-11-17 MED ORDER — SIMETHICONE 80 MG PO CHEW: 80.0000 mg | CHEWABLE_TABLET | ORAL | Status: DC | PRN

## 2021-11-17 MED ORDER — MAGNESIUM SULFATE BOLUS VIA INFUSION
4.0000 g | Freq: Once | INTRAVENOUS | Status: AC
  Administered 2021-11-17: 4 g via INTRAVENOUS
  Filled 2021-11-17: qty 1000

## 2021-11-17 MED ORDER — OXYTOCIN-SODIUM CHLORIDE 30-0.9 UT/500ML-% IV SOLN
INTRAVENOUS | Status: DC | PRN
  Administered 2021-11-17: 300 mL via INTRAVENOUS

## 2021-11-17 MED ORDER — ACETAMINOPHEN 10 MG/ML IV SOLN
1000.0000 mg | Freq: Once | INTRAVENOUS | Status: DC | PRN
  Administered 2021-11-17: 1000 mg via INTRAVENOUS

## 2021-11-17 MED ORDER — PHENYLEPHRINE 80 MCG/ML (10ML) SYRINGE FOR IV PUSH (FOR BLOOD PRESSURE SUPPORT)
PREFILLED_SYRINGE | INTRAVENOUS | Status: DC | PRN
  Administered 2021-11-17: 160 ug via INTRAVENOUS

## 2021-11-17 MED ORDER — LABETALOL HCL 5 MG/ML IV SOLN
40.0000 mg | INTRAVENOUS | Status: DC | PRN
  Administered 2021-11-17: 40 mg via INTRAVENOUS
  Filled 2021-11-17: qty 8

## 2021-11-17 MED ORDER — OXYCODONE-ACETAMINOPHEN 5-325 MG PO TABS
1.0000 | ORAL_TABLET | ORAL | Status: DC | PRN
  Administered 2021-11-17 – 2021-11-18 (×5): 2 via ORAL
  Administered 2021-11-19 (×2): 1 via ORAL
  Filled 2021-11-17 (×2): qty 2
  Filled 2021-11-17: qty 1
  Filled 2021-11-17 (×3): qty 2
  Filled 2021-11-17: qty 1

## 2021-11-17 MED ORDER — LABETALOL HCL 5 MG/ML IV SOLN: 80.0000 mg | INTRAVENOUS | Status: DC | PRN

## 2021-11-17 MED ORDER — SIMETHICONE 80 MG PO CHEW
80.0000 mg | CHEWABLE_TABLET | Freq: Three times a day (TID) | ORAL | Status: DC
  Administered 2021-11-17 – 2021-11-19 (×5): 80 mg via ORAL
  Filled 2021-11-17 (×5): qty 1

## 2021-11-17 MED ORDER — FENTANYL CITRATE (PF) 100 MCG/2ML IJ SOLN
12.5000 ug | INTRAMUSCULAR | Status: DC | PRN
  Administered 2021-11-17 – 2021-11-18 (×2): 12.5 ug via INTRAVENOUS
  Filled 2021-11-17 (×2): qty 2

## 2021-11-17 MED ORDER — TETANUS-DIPHTH-ACELL PERTUSSIS 5-2.5-18.5 LF-MCG/0.5 IM SUSY: 0.5000 mL | PREFILLED_SYRINGE | Freq: Once | INTRAMUSCULAR | Status: DC

## 2021-11-17 MED ORDER — FAMOTIDINE 20 MG PO TABS
20.0000 mg | ORAL_TABLET | Freq: Two times a day (BID) | ORAL | Status: DC
  Administered 2021-11-17 – 2021-11-19 (×4): 20 mg via ORAL
  Filled 2021-11-17 (×4): qty 1

## 2021-11-17 MED ORDER — PRENATAL MULTIVITAMIN CH
1.0000 | ORAL_TABLET | Freq: Every day | ORAL | Status: DC
  Administered 2021-11-18: 1 via ORAL
  Filled 2021-11-17: qty 1

## 2021-11-17 MED ORDER — IBUPROFEN 600 MG PO TABS: 600.0000 mg | ORAL_TABLET | Freq: Four times a day (QID) | ORAL | Status: DC

## 2021-11-17 MED ORDER — MORPHINE SULFATE (PF) 0.5 MG/ML IJ SOLN
INTRAMUSCULAR | Status: DC | PRN
  Administered 2021-11-17: 3 ug via EPIDURAL

## 2021-11-17 SURGICAL SUPPLY — 33 items
BENZOIN TINCTURE PRP APPL 2/3 (GAUZE/BANDAGES/DRESSINGS) IMPLANT
CHLORAPREP W/TINT 26ML (MISCELLANEOUS) ×2 IMPLANT
CLAMP UMBILICAL CORD (MISCELLANEOUS) ×1 IMPLANT
CLOTH BEACON ORANGE TIMEOUT ST (SAFETY) ×1 IMPLANT
DRSG OPSITE POSTOP 4X10 (GAUZE/BANDAGES/DRESSINGS) ×1 IMPLANT
ELECT REM PT RETURN 9FT ADLT (ELECTROSURGICAL) ×1
ELECTRODE REM PT RTRN 9FT ADLT (ELECTROSURGICAL) ×1 IMPLANT
EXTRACTOR VACUUM M CUP 4 TUBE (SUCTIONS) IMPLANT
GAUZE PAD ABD 7.5X8 STRL (GAUZE/BANDAGES/DRESSINGS) IMPLANT
GLOVE BIOGEL PI IND STRL 7.0 (GLOVE) ×1 IMPLANT
GLOVE SURG ORTHO 8.0 STRL STRW (GLOVE) ×1 IMPLANT
GOWN STRL REUS W/TWL LRG LVL3 (GOWN DISPOSABLE) ×2 IMPLANT
KIT ABG SYR 3ML LUER SLIP (SYRINGE) ×1 IMPLANT
NDL HYPO 25X5/8 SAFETYGLIDE (NEEDLE) ×1 IMPLANT
NEEDLE HYPO 25X5/8 SAFETYGLIDE (NEEDLE) ×1 IMPLANT
NS IRRIG 1000ML POUR BTL (IV SOLUTION) ×1 IMPLANT
PACK C SECTION WH (CUSTOM PROCEDURE TRAY) ×1 IMPLANT
PAD OB MATERNITY 4.3X12.25 (PERSONAL CARE ITEMS) ×1 IMPLANT
PENCIL SMOKE EVAC W/HOLSTER (ELECTROSURGICAL) IMPLANT
RETRACTOR WND ALEXIS 25 LRG (MISCELLANEOUS) IMPLANT
RTRCTR WOUND ALEXIS 25CM LRG (MISCELLANEOUS) ×1
STRIP CLOSURE SKIN 1/2X4 (GAUZE/BANDAGES/DRESSINGS) IMPLANT
SUT MNCRL 0 VIOLET CTX 36 (SUTURE) ×3 IMPLANT
SUT MON AB 4-0 PS1 27 (SUTURE) ×1 IMPLANT
SUT MONOCRYL 0 CTX 36 (SUTURE) ×3
SUT PDS AB 1 CT  36 (SUTURE)
SUT PDS AB 1 CT 36 (SUTURE) IMPLANT
SUT VIC AB 1 CTX 36 (SUTURE)
SUT VIC AB 1 CTX36XBRD ANBCTRL (SUTURE) IMPLANT
TAPE CLOTH SURG 4X10 WHT LF (GAUZE/BANDAGES/DRESSINGS) IMPLANT
TOWEL OR 17X24 6PK STRL BLUE (TOWEL DISPOSABLE) ×1 IMPLANT
TRAY FOLEY W/BAG SLVR 14FR LF (SET/KITS/TRAYS/PACK) ×1 IMPLANT
WATER STERILE IRR 1000ML POUR (IV SOLUTION) ×1 IMPLANT

## 2021-11-17 NOTE — Transfer of Care (Addendum)
Immediate Anesthesia Transfer of Care Note  Patient: Morgan [name]  Procedure(s) Performed: CESAREAN SECTION  Patient Location: PACU  Anesthesia Type:Epidural  Level of Consciousness: awake  Airway & Oxygen Therapy: Patient Spontanous Breathing  Post-op Assessment: Report given to RN and Post -op Vital signs reviewed and stable  Post vital signs: Reviewed and stable  Last Vitals:  Vitals Value Taken Time  BP 119/86 11/17/21 0801  Temp    Pulse 127 11/17/21 0805  Resp 22 11/17/21 0805  SpO2 97 % 11/17/21 0805  Vitals shown include unvalidated device data.  Last Pain:  Vitals:   11/17/21 0454  TempSrc:   PainSc: 6          Complications: No notable events documented.

## 2021-11-17 NOTE — Op Note (Signed)
Cesarean Section Procedure Note  Pre-operative Diagnosis: IUP at 38 weeks, arrest of dilation  Post-operative Diagnosis: same plus OP presentation  Surgeon: Namiko Pritts C   Assistants: none  Anesthesia: epidural  Procedure:  Low Segment Transverse cesarean section  Procedure Details  The patient was seen in the Holding Room. The risks, benefits, complications, treatment options, and expected outcomes were discussed with the patient.  The patient concurred with the proposed plan, giving informed consent.  The site of surgery properly noted/marked.. A Time Out was held and the above information confirmed.  After induction of anesthesia, the patient was draped and prepped in the usual sterile manner. A Pfannenstiel incision was made and carried down through the subcutaneous tissue to the fascia. Fascial incision was made and extended transversely. The fascia was separated from the underlying rectus tissue superiorly and inferiorly. The peritoneum was identified and entered. Peritoneal incision was extended longitudinally. The utero-vesical peritoneal reflection was incised transversely and the bladder flap was bluntly freed from the lower uterine segment. A low transverse uterine incision was made. Delivered from vertex presentation was a baby with Apgar scores of 9 at one minute and 9 at five minutes. After the umbilical cord was clamped and cut cord blood was obtained for evaluation. The placenta was removed intact and appeared normal. The uterine outline, tubes and ovaries appeared normal. The uterine incision was closed with running locked sutures of 0 monocryl and imbricated with 0 monocryl. Hemostasis was observed. Lavage was carried out until clear. The peritoneum was then closed with 0 monocryl and rectus muscles plicated in the midline.  After hemostasis was assured, the fascia was then reapproximated with running sutures of 0 Vicryl. Irrigation was applied and after adequate hemostasis was  assured, the skin was reapproximated with subcutaneous sutures using 4-0 monocryl.  Instrument, sponge, and needle counts were correct prior the abdominal closure and at the conclusion of the case. The patient received 2 grams cefotetan preoperatively.  Findings: Viable  female  Estimated Blood Loss:  680cc         Specimens: Placenta was sent to labor and delivery         Complications:  None

## 2021-11-17 NOTE — Progress Notes (Signed)
Patient ID: Morgan [name], female   DOB: [birth year], 31 y.o.   MRN: [id number] CTSP for no cx change and cervical edema with adequate labor VSSAF FHR 140s accels Ctxs adequate by IUPC for 6 hours with no change Cx per RN 5.5/90/-3  Arrest of dilation - rec primary c/s. Risks and benefits of C/S were discussed.  All questions were answered and informed consent was obtained.  Plan to proceed with low segment transverse Cesarean Section. Risks and benefits of C/S were discussed.  All questions were answered and informed consent was obtained.  Plan to proceed with low segment transverse Cesarean Section. DL

## 2021-11-17 NOTE — Lactation Note (Addendum)
This note was copied from a baby's chart. Lactation Consultation Note  Patient Name: Morgan [name] [id number] Date: 11/17/2021 Reason for consult: Initial assessment;Early term 37-38.6wks;Primapara Age:33 hours  Infant latched with relative ease & suckled some, but did not maintain latch. During consult, he repeatedly unlatched, relatched, suckled some, unlatched, etc. (interspersed with normal sleepy behavior). Infant's behavior at breast is normal for an infant at this age.    I encouraged latching infant when he shows cues & to try q2-3 hrs if he does not cue. Hand pump provided with size 21 flange in case infant does not latch.   Birth parent's cognition was affected by being on Magnesium and other meds she had received (as affirmed by her sister) as evidenced by birthing parent needing to be reminded of things and not noticing certain details. Although talkative, birth parent reported also being very tired, but had excellent help from her sister and husband.   Temporary, bilateral blanching was noted on tips of nipples after doing hand expression. However, mother did not complain of any change in sensation.   Maternal Data Has patient been taught Hand Expression?: Yes Does the patient have breastfeeding experience prior to this delivery?: No  Feeding Mother's Current Feeding Choice: Breast Milk  LATCH Score Latch: Repeated attempts needed to sustain latch, nipple held in mouth throughout feeding, stimulation needed to elicit sucking reflex.  Audible Swallowing: A few with stimulation  Type of Nipple: Everted at rest and after stimulation  Comfort (Breast/Nipple): Soft / non-tender  Hold (Positioning): Assistance needed to correctly position infant at breast and maintain latch.  LATCH Score: 7   Lactation Tools Discussed/Used Tools: Pump Breast pump type: Manual  Interventions    Discharge Pump: Manual;Personal (has a Spectra pump at home) [facility] Program: No  Consult  Status Consult Status: Follow-up Date: 11/18/21 Follow-up type: In-patient    Matthias [facility] 11/17/2021, 5:40 PM

## 2021-11-17 NOTE — Progress Notes (Signed)
POD0 from pCS for arrest of dilation. Hx gHTN, now with concern for preeclampsia with severe features. Notified by RN that BP has been uptrending  Subjective: Patient drowsy, reports feeling okay. Concerned about sleeping and holding baby. Denies RUQ pain, visual changes, headache currently, although difficulty to assess given level of drowsiness.  Objective: Today's Vitals   11/17/21 0900 11/17/21 0915 11/17/21 0930 11/17/21 0945  BP: (!) 139/124 (!) 156/103 (!) 159/102 (!) 166/87  Pulse: 99 92 80 92  Resp: 15 14 18 16   Temp:  99 F (37.2 C)  98.5 F (36.9 C)  TempSrc:  Oral  Oral  SpO2: 91% 90% 94% 92%  Weight:      Height:      PainSc: 4  3      Body mass index is 31.77 kg/m.   Physical Exam:  General: Drowsy, but responsive Incision: pressure dressing on top, wnl DVT Evaluation: +bilateral edema, no evidence of DVT  Recent Results (from the past 2160 hour(s))  OB RESULTS CONSOLE HIV antibody     Status: None   Collection Time: 09/01/21 12:00 AM  Result Value Ref Range   HIV Non-reactive   OB RESULT CONSOLE Group B Strep     Status: None   Collection Time: 11/04/21 12:00 AM  Result Value Ref Range   GBS Positive   CBC     Status: None   Collection Time: 11/15/21  5:22 PM  Result Value Ref Range   WBC 9.2 4.0 - 10.5 K/uL   RBC 3.89 3.87 - 5.11 MIL/uL   Hemoglobin 12.1 12.0 - 15.0 g/dL   HCT 11/17/21 44.3 - 15.4 %   MCV 93.6 80.0 - 100.0 fL   MCH 31.1 26.0 - 34.0 pg   MCHC 33.2 30.0 - 36.0 g/dL   RDW 00.8 67.6 - 19.5 %   Platelets 167 150 - 400 K/uL   nRBC 0.0 0.0 - 0.2 %    Comment: Performed at [facility], [address], [place], [place]  Type and screen [facility]     Status: None   Collection Time: 11/15/21  5:22 PM  Result Value Ref Range   ABO/RH(D) A POS    Antibody Screen NEG    Sample Expiration      11/18/2021,2359 Performed at [facility], [address] [place], [place]   RPR     Status: None    Collection Time: 11/15/21  5:22 PM  Result Value Ref Range   RPR Ser Ql NON REACTIVE NON REACTIVE    Comment: Performed at [facility], [address], [place], [place]  Comprehensive metabolic panel     Status: Abnormal   Collection Time: 11/15/21  5:22 PM  Result Value Ref Range   Sodium 137 135 - 145 mmol/L   Potassium 3.7 3.5 - 5.1 mmol/L   Chloride 107 98 - 111 mmol/L   CO2 20 (L) 22 - 32 mmol/L   Glucose, Bld 94 70 - 99 mg/dL    Comment: Glucose reference range applies only to samples taken after fasting for at least 8 hours.   BUN 10 6 - 20 mg/dL   Creatinine, Ser 11/17/21 0.44 - 1.00 mg/dL   Calcium 9.0 8.9 - 2.50 mg/dL   Total Protein 6.2 (L) 6.5 - 8.1 g/dL   Albumin 3.0 (L) 3.5 - 5.0 g/dL   AST 21 15 - 41 U/L   ALT 15  0 - 44 U/L   Alkaline Phosphatase 85 38 - 126 U/L   Total Bilirubin 0.6 0.3 - 1.2 mg/dL   GFR, Estimated >60 >60 mL/min    Comment: (NOTE) Calculated using the CKD-EPI Creatinine Equation (2021)    Anion gap 10 5 - 15    Comment: Performed at [facility] [address], [place], [place] [address]  CBC     Status: Abnormal   Collection Time: 11/16/21  5:00 AM  Result Value Ref Range   WBC 14.8 (H) 4.0 - 10.5 K/uL   RBC 3.92 3.87 - 5.11 MIL/uL   Hemoglobin 12.4 12.0 - 15.0 g/dL   HCT 36.2 36.0 - 46.0 %   MCV 92.3 80.0 - 100.0 fL   MCH 31.6 26.0 - 34.0 pg   MCHC 34.3 30.0 - 36.0 g/dL   RDW 12.2 11.5 - 15.5 %   Platelets 163 150 - 400 K/uL   nRBC 0.0 0.0 - 0.2 %    Comment: Performed at [facility], [place] [address], [place], [place] [address]     Assessment/Plan: Plan to start magnesium (4g bolus, 2g maintenance) for 24 hours postpartum (to be discontinued in AM) for preE WSF given multiple severe range Bps requiring IV treatment. Will repeat CBC, CMP today. Had been reassuring. Closely monitor.   LOS: 1 day   Hurshel Party, MD

## 2021-11-17 NOTE — Progress Notes (Signed)
Pt is refusing Magnesium at this time. She feels her BP is elevated due to lack of sleep, hunger, and pain. She wishes to wait a while and see what her BP is after a nap. Will notify Dr Bridgett Larsson.

## 2021-11-17 NOTE — Anesthesia Postprocedure Evaluation (Signed)
Anesthesia Post Note  Patient: Morgan [name]  Procedure(s) Performed: CESAREAN SECTION     Patient location during evaluation: PACU Anesthesia Type: Epidural Level of consciousness: oriented and awake and alert Pain management: pain level controlled Vital Signs Assessment: post-procedure vital signs reviewed and stable Respiratory status: spontaneous breathing, respiratory function stable and patient connected to nasal cannula oxygen Cardiovascular status: blood pressure returned to baseline and stable Postop Assessment: no headache, no backache, no apparent nausea or vomiting and epidural receding Anesthetic complications: no   No notable events documented.  Last Vitals:  Vitals:   11/17/21 1625 11/17/21 1723  BP: 139/86 125/77  Pulse: 89 88  Resp: 17 18  Temp:    SpO2: 97% 98%    Last Pain:  Vitals:   11/17/21 1524  TempSrc:   PainSc: 3    Pain Goal: Patients Stated Pain Goal: 3 (11/17/21 1325)                 Sang Blount L Breonna Gafford

## 2021-11-17 NOTE — Progress Notes (Addendum)
Delayed Entry Note  Notified by RN that patient desires to hold off on magnesium as she believes her elevated BP is secondary to frustration and pain. I discussed with the patient, her sister, and her husband at bedside that given her multiple severe range blood pressures requiring multiple doses of IV labetalol, I worry about preeclampsia and sequelae including stroke and seizures. Therefore, I would recommend starting magnesium as soon as she is able. Concerns addressed re: breastfeeding and eating while on the mag. Side effects of magnesium discussed. Patient agreeable to start mag after taking a short 30 minute break. We discussed that this is reasonable. All questions answered.

## 2021-11-18 ENCOUNTER — Encounter (HOSPITAL_COMMUNITY): Payer: Self-pay | Admitting: Obstetrics and Gynecology

## 2021-11-18 LAB — COMPREHENSIVE METABOLIC PANEL
ALT: 13 U/L (ref 0–44)
AST: 32 U/L (ref 15–41)
Albumin: 1.8 g/dL — ABNORMAL LOW (ref 3.5–5.0)
Alkaline Phosphatase: 67 U/L (ref 38–126)
Anion gap: 6 (ref 5–15)
BUN: 8 mg/dL (ref 6–20)
CO2: 24 mmol/L (ref 22–32)
Calcium: 8.2 mg/dL — ABNORMAL LOW (ref 8.9–10.3)
Chloride: 106 mmol/L (ref 98–111)
Creatinine, Ser: 0.99 mg/dL (ref 0.44–1.00)
GFR, Estimated: >60 mL/min (ref >60)
Glucose, Bld: 103 mg/dL — ABNORMAL HIGH (ref 70–99)
Potassium: 4.3 mmol/L (ref 3.5–5.1)
Sodium: 136 mmol/L (ref 135–145)
Total Bilirubin: 0.2 mg/dL — ABNORMAL LOW (ref 0.3–1.2)
Total Protein: 4.6 g/dL — ABNORMAL LOW (ref 6.5–8.1)

## 2021-11-18 LAB — CBC
HCT: 22.6 % — ABNORMAL LOW (ref 36.0–46.0)
Hemoglobin: 7.7 g/dL — ABNORMAL LOW (ref 12.0–15.0)
MCH: 32.1 pg (ref 26.0–34.0)
MCHC: 34.1 g/dL (ref 30.0–36.0)
MCV: 94.2 fL (ref 80.0–100.0)
Platelets: 116 10*3/uL — ABNORMAL LOW (ref 150–400)
RBC: 2.4 MIL/uL — ABNORMAL LOW (ref 3.87–5.11)
RDW: 12.4 % (ref 11.5–15.5)
WBC: 11.8 10*3/uL — ABNORMAL HIGH (ref 4.0–10.5)
nRBC: 0 % (ref 0.0–0.2)

## 2021-11-18 MED ORDER — FERROUS SULFATE 325 (65 FE) MG PO TABS
325.0000 mg | ORAL_TABLET | ORAL | Status: DC
  Administered 2021-11-18: 325 mg via ORAL
  Filled 2021-11-18: qty 1

## 2021-11-18 MED ORDER — DOCUSATE SODIUM 100 MG PO CAPS
100.0000 mg | ORAL_CAPSULE | Freq: Every day | ORAL | Status: DC
  Administered 2021-11-18 – 2021-11-19 (×2): 100 mg via ORAL
  Filled 2021-11-18 (×2): qty 1

## 2021-11-18 NOTE — Progress Notes (Addendum)
Postpartum Progress Note  Postpartum Day 1 s/p primary Cesarean section for arrest of dilation, with recent concern for development of preeclampsia with severe features (severe range BP).  Subjective:  Patient reports no overnight events.  She reports well controlled pain, ambulated x 1 without difficulty, foley recently removed and has not attempted void, tolerating PO.  She reports Positive flatus, Negative BM.  Vaginal bleeding is minimal.  Objective: Blood pressure 113/69, pulse (!) 101, temperature 97.9 F (36.6 C), temperature source Oral, resp. rate 17, height 4\' 11"  (1.499 m), weight 71.4 kg, SpO2 95 %, unknown if currently breastfeeding.  Physical Exam:  General: alert and no distress Lochia: appropriate Uterine Fundus: firm Incision: dressing in place DVT Evaluation: No evidence of DVT seen on physical exam.  Recent Labs    11/16/21 0500 11/17/21 1040  HGB 12.4 9.7*  HCT 36.2 28.1*    Assessment/Plan: Postpartum Day 1, s/p C-section Lactation following Baby boy - patient is still deciding regarding circumcision or not Preeclampsia with severe features (severe range BP), discontinue magnesium after 24 hrs.  BP much improved overnight.  Labs reassuring and Cr improved 1.59 >> 0.99 this AM.  Urine output has been adequate Acute blood loss anemia - No signs or symptoms of anemia.   Doing well, continue routine postpartum care.    LOS: 3 days   Carlyon Shadow 11/18/2021, 7:24 AM

## 2021-11-19 MED ORDER — LABETALOL HCL 200 MG PO TABS: 200.0000 mg | ORAL_TABLET | Freq: Three times a day (TID) | ORAL | 0 refills | Status: AC

## 2021-11-19 MED ORDER — OXYCODONE-ACETAMINOPHEN 5-325 MG PO TABS: 1.0000 | ORAL_TABLET | ORAL | 0 refills | Status: AC | PRN

## 2021-11-19 NOTE — Lactation Note (Signed)
This note was copied from a baby's chart. Lactation Consultation Note  Patient Name: Morgan [name] DTOIZ'T Date: 11/19/2021 Reason for consult: Follow-up assessment;Early term 37-38.6wks;Primapara;1st time breastfeeding;Infant weight loss (8 % weight loss, post circ , per Birth parent , baby sleepy. LC offered to assist, woke baby up and when latching fell back to sleep. LC offered to assist to hand exp and Birth parent preferred to just do STS and hug baby.) Age:40 hours Voids and stools correlate with the weight loss.  LC recommended in the next 30 mins , to wake the baby up, hand express and spoon feed some EBM.  LC reviewed BF D/C teaching.  Maternal Data Has patient been taught Hand Expression?:  (per Birth parent feels comfortable with hand expressing)  Feeding Mother's Current Feeding Choice: Breast Milk  LATCH Score Latch: Too sleepy or reluctant, no latch achieved, no sucking elicited.  Audible Swallowing: None  Type of Nipple: Everted at rest and after stimulation  Comfort (Breast/Nipple): Soft / non-tender  Hold (Positioning): Assistance needed to correctly position infant at breast and maintain latch.  LATCH Score: 5   Lactation Tools Discussed/Used Tools: Pump Breast pump type: Manual  Interventions Interventions: Breast feeding basics reviewed;Assisted with latch;Skin to skin;Adjust position;Support pillows;Hand pump;Education  Discharge Discharge Education: Engorgement and breast care;Warning signs for feeding baby Pump: Manual;DEBP;Personal  Consult Status Consult Status: Complete Date: 11/19/21    Myer Haff 11/19/2021, 12:14 PM

## 2021-11-19 NOTE — Discharge Summary (Signed)
Postpartum Discharge Summary  Date of Service November 19, 2021     Patient Name: Morgan [name] DOB: [birth year] MRN: [id number]  Date of admission: 11/15/2021 Delivery date:11/17/2021  Delivering provider: Louretta Shorten  Date of discharge: 11/19/2021  Admitting diagnosis: Encounter for induction of labor [Z34.90] Intrauterine pregnancy: [redacted]w[redacted]d    Secondary diagnosis:  Principal Problem:   Encounter for induction of labor  Additional problems: none    Discharge diagnosis: Preeclampsia (severe)                                              Post partum procedures: none Augmentation: AROM, Pitocin, and Cytotec Complications: None  Hospital course: Induction of Labor With Cesarean Section   31y.o. yo G1P1001 at 32w2das admitted to the hospital 11/15/2021 for induction of labor. Patient had a labor course significant for failure to progress. The patient went for cesarean section due to Arrest of Dilation. Delivery details are as follows: Membrane Rupture Time/Date: 3:55 AM ,11/16/2021   Delivery Method:C-Section, Low Transverse  Details of operation can be found in separate operative Note.  Patient had an uncomplicated postpartum course. She is ambulating, tolerating a regular diet, passing flatus, and urinating well.  Patient is discharged home in stable condition on 11/19/21.      Newborn Data: Birth date:11/17/2021  Birth time:7:08 AM  Gender:Female  Living status:Living  Apgars:8 ,9  Weight:3140 g                                Magnesium Sulfate received: Yes: Seizure prophylaxis BMZ received: No Rhophylac:N/A MMR:N/A T-DaP:Given prenatally Flu: N/A Transfusion:No  Physical exam  Vitals:   11/18/21 1941 11/19/21 0002 11/19/21 0411 11/19/21 0852  BP: 131/82 113/72 130/84 (!) 152/97  Pulse: 85 (!) 105 97 90  Resp: _0 Temp: 98.1 F (36.7 C) 98.6 F (37 C) 97.8 F (36.6 C)   TempSrc: Oral Axillary Oral   SpO2: 100% 100% 100% 100%  Weight:      Height:        General: alert, cooperative, and no distress Lochia: appropriate Uterine Fundus: firm Incision: Healing well with no significant drainage DVT Evaluation: No evidence of DVT seen on physical exam. Labs: Lab Results  Component Value Date   WBC 11.8 (H) 11/18/2021   HGB 7.7 (L) 11/18/2021   HCT 22.6 (L) 11/18/2021   MCV 94.2 11/18/2021   PLT 116 (L) 11/18/2021      Latest Ref Rng & Units 11/18/2021    7:00 AM  CMP  Glucose 70 - 99 mg/dL 103   BUN 6 - 20 mg/dL 8   Creatinine 0.44 - 1.00 mg/dL 0.99   Sodium 135 - 145 mmol/L 136   Potassium 3.5 - 5.1 mmol/L 4.3   Chloride 98 - 111 mmol/L 106   CO2 22 - 32 mmol/L 24   Calcium 8.9 - 10.3 mg/dL 8.2   Total Protein 6.5 - 8.1 g/dL 4.6   Total Bilirubin 0.3 - 1.2 mg/dL 0.2   Alkaline Phos 38 - 126 U/L 67   AST 15 - 41 U/L 32   ALT 0 - 44 U/L 13    Edinburgh Score:     No data to display  After visit meds:  Allergies as of 11/19/2021       Reactions   Bee Venom Hives   Cefaclor         Medication List     STOP taking these medications    amoxicillin-clavulanate 875-125 MG tablet Commonly known as: AUGMENTIN   cyclobenzaprine 5 MG tablet Commonly known as: FLEXERIL   diphenhydrAMINE 25 mg capsule Commonly known as: BENADRYL   famotidine 20 MG tablet Commonly known as: PEPCID   hydrOXYzine 25 MG tablet Commonly known as: ATARAX   ibuprofen 200 MG tablet Commonly known as: ADVIL   ondansetron 4 MG disintegrating tablet Commonly known as: Zofran ODT   PRENATAL GUMMIES PO   ROBISEN PO   Sprintec 28 0.25-35 MG-MCG tablet Generic drug: norgestimate-ethinyl estradiol       TAKE these medications    labetalol 200 MG tablet Commonly known as: NORMODYNE Take 1 tablet (200 mg total) by mouth 3 (three) times daily.   oxyCODONE-acetaminophen 5-325 MG tablet Commonly known as: PERCOCET/ROXICET Take 1-2 tablets by mouth every 4 (four) hours as needed for moderate pain.          Discharge home in stable condition Infant Feeding: Breast Infant Disposition:home with mother Discharge instruction: per After Visit Summary and Postpartum booklet. Activity: Advance as tolerated. Pelvic rest for 6 weeks.  Diet: routine diet Anticipated Birth Control: Unsure Postpartum Appointment:1 week Additional Postpartum F/U: Incision check 1 week and BP check 1 week Future Appointments:No future appointments. Follow up Visit:      11/19/2021 Cyril Mourning, MD

## 2021-11-19 NOTE — Progress Notes (Signed)
Discharge instructions and prescriptions given to pt. Discussed post c-section care, signs and symptoms to report to the MD, upcoming appointments, and meds. Pt verbalizes understanding and has no questions or concerns at this time. Pt discharged home from hospital with baby in stable condition. °

## 2021-11-19 NOTE — Lactation Note (Addendum)
This note was copied from a baby's chart. Lactation Consultation Note  Patient Name: Morgan [name] [id number] Date: 11/19/2021 Reason for consult: Follow-up assessment;RN request Age:33 hours  RN requested lactation to follow up with patient prior to discharge. Baby has not latched since 0500, and parents have been spoon feeding EBM. Parents request latch help to feel comfortable with discharge. This LC was already on OBSC when RN asked me to enter the room.  Support parent was waking baby upon entry in the bassinet. I showed parents how to place baby on chest between breasts. Rooting was noted immediately and I showed parents how to guide baby to the left breast in a modified laid back hold. Baby latched with rhythmic suckling sequences.  When baby became sleepy, we placed him on chest. He began to root again, and this time, I allowed birth parent and support parent to latch baby independently to the right breast. Baby actively fed with notable rhythmic jaw excursions. I showed parents how to gently "pester" baby to stay active at the breast.  Baby still actively feeding as Love exited having observed 10+ minutes at the breast.  Discharge information reviewed. Parents plan for lactation follow up this weekend and have an appointment scheduled [facility] on Sunday.  Maternal Data Has patient been taught Hand Expression?:  (per Birth parent feels comfortable with hand expressing)  Feeding Mother's Current Feeding Choice: Breast Milk  LATCH Score Latch: Grasps breast easily, tongue down, lips flanged, rhythmical sucking.  Audible Swallowing: A few with stimulation  Type of Nipple: Everted at rest and after stimulation  Comfort (Breast/Nipple): Soft / non-tender  Hold (Positioning): Assistance needed to correctly position infant at breast and maintain latch.  LATCH Score: 8   Lactation Tools Discussed/Used Tools: Pump Breast pump type:  Manual  Interventions Interventions: Breast feeding basics reviewed;Assisted with latch;Skin to skin;Hand express;Breast compression;Adjust position;Support pillows;Education  Discharge Discharge Education: Engorgement and breast care;Warning signs for feeding baby Pump: Manual;DEBP;Personal  Consult Status Consult Status: Complete Date: 11/19/21    Lenore Manner 11/19/2021, 1:53 PM

## 2021-11-24 ENCOUNTER — Inpatient Hospital Stay (HOSPITAL_COMMUNITY): Payer: BC Managed Care – PPO

## 2021-11-25 ENCOUNTER — Telehealth (HOSPITAL_COMMUNITY): Payer: Self-pay | Admitting: *Deleted

## 2021-11-25 NOTE — Telephone Encounter (Signed)
Mom reports feeling good. Incision healing well per mom. No concerns regarding herself at this time. EPDS=3 (hospital score=not found) Mom reports baby is well. Feeding, peeing, and pooping without difficulty. Reviewed safe sleep. Mom has no concerns about baby at present.  Odis Hollingshead, RN 11-25-2021 at 2:36pm

## 2023-11-14 ENCOUNTER — Emergency Department (HOSPITAL_BASED_OUTPATIENT_CLINIC_OR_DEPARTMENT_OTHER)
Admission: EM | Admit: 2023-11-14 | Discharge: 2023-11-14 | Disposition: A | Discharge status: Home / Self Care | Attending: Emergency Medicine | Admitting: Emergency Medicine

## 2023-11-14 ENCOUNTER — Other Ambulatory Visit: Payer: Self-pay

## 2023-11-14 ENCOUNTER — Encounter (HOSPITAL_BASED_OUTPATIENT_CLINIC_OR_DEPARTMENT_OTHER): Payer: Self-pay | Admitting: Emergency Medicine

## 2023-11-14 DIAGNOSIS — J029 Acute pharyngitis, unspecified: Secondary | ICD-10-CM | POA: Diagnosis present

## 2023-11-14 DIAGNOSIS — E860 Dehydration: Secondary | ICD-10-CM | POA: Insufficient documentation

## 2023-11-14 DIAGNOSIS — J020 Streptococcal pharyngitis: Secondary | ICD-10-CM | POA: Diagnosis not present

## 2023-11-14 LAB — HCG, QUANTITATIVE, PREGNANCY: hCG, Beta Chain, Quant, S: <1 m[IU]/mL (ref <5)

## 2023-11-14 LAB — CBC WITH DIFFERENTIAL/PLATELET
Abs Immature Granulocytes: 0.05 K/uL (ref 0.00–0.07)
Basophils Absolute: 0 K/uL (ref 0.0–0.1)
Basophils Relative: 0 %
Eosinophils Absolute: 0.1 K/uL (ref 0.0–0.5)
Eosinophils Relative: 1 %
HCT: 43 % (ref 36.0–46.0)
Hemoglobin: 14.4 g/dL (ref 12.0–15.0)
Immature Granulocytes: 1 %
Lymphocytes Relative: 14 %
Lymphs Abs: 1.4 K/uL (ref 0.7–4.0)
MCH: 32.6 pg (ref 26.0–34.0)
MCHC: 33.5 g/dL (ref 30.0–36.0)
MCV: 97.3 fL (ref 80.0–100.0)
Monocytes Absolute: 1 K/uL (ref 0.1–1.0)
Monocytes Relative: 10 %
Neutro Abs: 7.7 K/uL (ref 1.7–7.7)
Neutrophils Relative %: 74 %
Platelets: 202 K/uL (ref 150–400)
RBC: 4.42 MIL/uL (ref 3.87–5.11)
RDW: 12 % (ref 11.5–15.5)
WBC: 10.3 K/uL (ref 4.0–10.5)
nRBC: 0 % (ref 0.0–0.2)

## 2023-11-14 LAB — URINALYSIS, W/ REFLEX TO CULTURE (INFECTION SUSPECTED)
Bacteria, UA: NONE SEEN
Bilirubin Urine: NEGATIVE
Glucose, UA: NEGATIVE mg/dL
Ketones, ur: 40 mg/dL — AB
Leukocytes,Ua: NEGATIVE
Nitrite: NEGATIVE
Protein, ur: 30 mg/dL — AB
RBC / HPF: >50 RBC/hpf (ref 0–5)
Specific Gravity, Urine: 1.035 — ABNORMAL HIGH (ref 1.005–1.030)
pH: 6 (ref 5.0–8.0)

## 2023-11-14 LAB — COMPREHENSIVE METABOLIC PANEL WITH GFR
ALT: 19 U/L (ref 0–44)
AST: 19 U/L (ref 15–41)
Albumin: 4.6 g/dL (ref 3.5–5.0)
Alkaline Phosphatase: 87 U/L (ref 38–126)
Anion gap: 18 — ABNORMAL HIGH (ref 5–15)
BUN: 10 mg/dL (ref 6–20)
CO2: 22 mmol/L (ref 22–32)
Calcium: 10.3 mg/dL (ref 8.9–10.3)
Chloride: 99 mmol/L (ref 98–111)
Creatinine, Ser: 0.6 mg/dL (ref 0.44–1.00)
GFR, Estimated: >60 mL/min (ref >60)
Glucose, Bld: 91 mg/dL (ref 70–99)
Potassium: 3.5 mmol/L (ref 3.5–5.1)
Sodium: 139 mmol/L (ref 135–145)
Total Bilirubin: 0.3 mg/dL (ref 0.0–1.2)
Total Protein: 8.4 g/dL — ABNORMAL HIGH (ref 6.5–8.1)

## 2023-11-14 LAB — LACTIC ACID, PLASMA: Lactic Acid, Venous: 0.8 mmol/L (ref 0.5–1.9)

## 2023-11-14 LAB — PREGNANCY, URINE: Preg Test, Ur: NEGATIVE

## 2023-11-14 MED ORDER — DEXAMETHASONE SODIUM PHOSPHATE 10 MG/ML IJ SOLN
10.0000 mg | Freq: Once | INTRAMUSCULAR | Status: AC
  Administered 2023-11-14: 10 mg via INTRAVENOUS
  Filled 2023-11-14: qty 1

## 2023-11-14 MED ORDER — LACTATED RINGERS IV BOLUS
1000.0000 mL | Freq: Once | INTRAVENOUS | Status: AC
  Administered 2023-11-14: 1000 mL via INTRAVENOUS

## 2023-11-14 NOTE — ED Triage Notes (Signed)
 Pt c/o sore throat x4 days. Reports n/v x 3 days. Dx with strep, covid/flu neg Currently taking abx.

## 2023-11-14 NOTE — ED Provider Notes (Signed)
  [place] EMERGENCY DEPARTMENT AT [facility] Provider Note   CSN: [id number] Arrival date & time: 11/14/23  1633     Patient presents with: Sore Throat   Morgan [name] is a 33 y.o. female.  {Add pertinent medical, surgical, social history, OB history to HPI:32947} HPI     Prior to Admission medications   Medication Sig Start Date End Date Taking? Authorizing Provider  labetalol  (NORMODYNE ) 200 MG tablet Take 1 tablet (200 mg total) by mouth 3 (three) times daily. 11/19/21   Mat Browning, MD  oxyCODONE -acetaminophen  (PERCOCET/ROXICET) 5-325 MG tablet Take 1-2 tablets by mouth every 4 (four) hours as needed for moderate pain. 11/19/21   Mat Browning, MD    Allergies: Bee venom and Cefaclor    Review of Systems  Updated Vital Signs BP (!) 120/98   Pulse 73   Temp 98.9 F (37.2 C) (Oral)   Resp 20   Wt 63.5 kg   LMP 11/13/2023   SpO2 99%   BMI 28.28 kg/m   Physical Exam  (all labs ordered are listed, but only abnormal results are displayed) Labs Reviewed  COMPREHENSIVE METABOLIC PANEL WITH GFR - Abnormal; Notable for the following components:      Result Value   Total Protein 8.4 (*)    Anion gap 18 (*)    All other components within normal limits  URINALYSIS, W/ REFLEX TO CULTURE (INFECTION SUSPECTED) - Abnormal; Notable for the following components:   Specific Gravity, Urine 1.035 (*)    Hgb urine dipstick LARGE (*)    Ketones, ur 40 (*)    Protein, ur 30 (*)    All other components within normal limits  LACTIC ACID, PLASMA  CBC WITH DIFFERENTIAL/PLATELET  PREGNANCY, URINE  LACTIC ACID, PLASMA    EKG: None  Radiology: No results found.  {Document cardiac monitor, telemetry assessment procedure when appropriate:32947} Procedures   Medications Ordered in the ED - No data to display    {Click here for ABCD2, HEART and other calculators REFRESH Note before signing:1}                              Medical Decision Making Amount  and/or Complexity of Data Reviewed Labs: ordered.   ***  {Document critical care time when appropriate  Document review of labs and clinical decision tools ie CHADS2VASC2, etc  Document your independent review of radiology images and any outside records  Document your discussion with family members, caretakers and with consultants  Document social determinants of health affecting pt's care  Document your decision making why or why not admission, treatments were needed:32947:::1}   Final diagnoses:  None    ED Discharge Orders     None

## 2023-11-14 NOTE — ED Notes (Signed)
Red top collected.

## 2023-11-14 NOTE — Discharge Instructions (Signed)
 1.  You have been given a dose of Decadron  in the emergency department.  This will help with pain and swelling with strep throat.  Continue to take Zofran  if needed.  Try to hydrate well.  Take extra strength Tylenol  for additional pain control if needed. 2.  Continue your amoxicillin . 3.  Return to the emergency department if you feel like you are getting dehydrated cannot tolerate your medications or other concerning changes.

## 2023-11-14 NOTE — ED Notes (Signed)
 Ambulated to restroom with steady gait.
